# Patient Record
Sex: Female | Born: 1955
Health system: Southern US, Community
[De-identification: ages and names within clinical notes are randomized; demographics above are authoritative.]

## PROBLEM LIST (undated history)

## (undated) DIAGNOSIS — L739 Follicular disorder, unspecified: Secondary | ICD-10-CM

## (undated) DIAGNOSIS — Z8719 Personal history of other diseases of the digestive system: Secondary | ICD-10-CM

## (undated) DIAGNOSIS — T7840XA Allergy, unspecified, initial encounter: Secondary | ICD-10-CM

## (undated) DIAGNOSIS — M79671 Pain in right foot: Secondary | ICD-10-CM

## (undated) DIAGNOSIS — R05 Cough: Secondary | ICD-10-CM

## (undated) DIAGNOSIS — I10 Essential (primary) hypertension: Secondary | ICD-10-CM

## (undated) DIAGNOSIS — E785 Hyperlipidemia, unspecified: Secondary | ICD-10-CM

## (undated) DIAGNOSIS — L500 Allergic urticaria: Secondary | ICD-10-CM

## (undated) DIAGNOSIS — M25462 Effusion, left knee: Secondary | ICD-10-CM

## (undated) HISTORY — DX: Allergy, unspecified, initial encounter: T78.40XA

## (undated) HISTORY — DX: Hyperlipidemia, unspecified: E78.5

## (undated) HISTORY — PX: DILATION AND CURETTAGE OF UTERUS: SHX78

## (undated) HISTORY — DX: Essential (primary) hypertension: I10

## (undated) HISTORY — PX: TUBAL LIGATION: SHX77

---
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
## 1997-07-16 ENCOUNTER — Ambulatory Visit (HOSPITAL_COMMUNITY): Admission: RE | Admit: 1997-07-16 | Discharge: 1997-07-16 | Payer: Self-pay | Admitting: Obstetrics

## 1999-04-13 ENCOUNTER — Encounter: Admission: RE | Admit: 1999-04-13 | Discharge: 1999-04-13 | Payer: Self-pay | Admitting: Obstetrics & Gynecology

## 1999-06-08 ENCOUNTER — Encounter: Admission: RE | Admit: 1999-06-08 | Discharge: 1999-06-08 | Payer: Self-pay | Admitting: Obstetrics & Gynecology

## 1999-09-30 ENCOUNTER — Inpatient Hospital Stay (HOSPITAL_COMMUNITY): Admission: AD | Admit: 1999-09-30 | Discharge: 1999-09-30 | Payer: Self-pay | Admitting: Obstetrics & Gynecology

## 1999-10-03 ENCOUNTER — Emergency Department (HOSPITAL_COMMUNITY): Admission: EM | Admit: 1999-10-03 | Discharge: 1999-10-04 | Payer: Self-pay | Admitting: *Deleted

## 2000-09-05 ENCOUNTER — Encounter: Admission: RE | Admit: 2000-09-05 | Discharge: 2000-09-05 | Payer: Self-pay | Admitting: Obstetrics & Gynecology

## 2000-09-22 ENCOUNTER — Ambulatory Visit (HOSPITAL_COMMUNITY): Admission: RE | Admit: 2000-09-22 | Discharge: 2000-09-22 | Payer: Self-pay | Admitting: Obstetrics & Gynecology

## 2000-10-10 ENCOUNTER — Encounter: Admission: RE | Admit: 2000-10-10 | Discharge: 2000-10-10 | Payer: Self-pay | Admitting: Obstetrics & Gynecology

## 2000-10-10 ENCOUNTER — Encounter: Admission: RE | Admit: 2000-10-10 | Discharge: 2000-10-10 | Payer: Self-pay | Admitting: Internal Medicine

## 2000-12-03 ENCOUNTER — Emergency Department (HOSPITAL_COMMUNITY): Admission: EM | Admit: 2000-12-03 | Discharge: 2000-12-03 | Payer: Self-pay | Admitting: Emergency Medicine

## 2000-12-03 ENCOUNTER — Encounter: Payer: Self-pay | Admitting: Emergency Medicine

## 2001-01-31 ENCOUNTER — Encounter: Admission: RE | Admit: 2001-01-31 | Discharge: 2001-01-31 | Payer: Self-pay | Admitting: Internal Medicine

## 2001-02-01 ENCOUNTER — Encounter: Admission: RE | Admit: 2001-02-01 | Discharge: 2001-02-01 | Payer: Self-pay | Admitting: Internal Medicine

## 2001-02-27 ENCOUNTER — Encounter: Admission: RE | Admit: 2001-02-27 | Discharge: 2001-02-27 | Payer: Self-pay | Admitting: Obstetrics & Gynecology

## 2001-04-30 ENCOUNTER — Encounter: Admission: RE | Admit: 2001-04-30 | Discharge: 2001-04-30 | Payer: Self-pay | Admitting: Internal Medicine

## 2001-07-03 ENCOUNTER — Encounter: Admission: RE | Admit: 2001-07-03 | Discharge: 2001-07-03 | Payer: Self-pay | Admitting: Internal Medicine

## 2001-09-05 ENCOUNTER — Encounter: Admission: RE | Admit: 2001-09-05 | Discharge: 2001-09-05 | Payer: Self-pay

## 2001-12-26 ENCOUNTER — Encounter: Admission: RE | Admit: 2001-12-26 | Discharge: 2001-12-26 | Payer: Self-pay | Admitting: Internal Medicine

## 2002-01-15 ENCOUNTER — Encounter: Admission: RE | Admit: 2002-01-15 | Discharge: 2002-01-15 | Payer: Self-pay | Admitting: Internal Medicine

## 2002-01-30 ENCOUNTER — Emergency Department (HOSPITAL_COMMUNITY): Admission: EM | Admit: 2002-01-30 | Discharge: 2002-01-30 | Payer: Self-pay | Admitting: Emergency Medicine

## 2002-03-25 ENCOUNTER — Encounter: Admission: RE | Admit: 2002-03-25 | Discharge: 2002-03-25 | Payer: Self-pay | Admitting: Internal Medicine

## 2002-04-05 ENCOUNTER — Encounter: Admission: RE | Admit: 2002-04-05 | Discharge: 2002-04-05 | Payer: Self-pay | Admitting: Internal Medicine

## 2002-07-25 ENCOUNTER — Encounter: Admission: RE | Admit: 2002-07-25 | Discharge: 2002-07-25 | Payer: Self-pay | Admitting: Internal Medicine

## 2002-08-28 ENCOUNTER — Encounter: Admission: RE | Admit: 2002-08-28 | Discharge: 2002-08-28 | Payer: Self-pay | Admitting: Internal Medicine

## 2003-01-20 ENCOUNTER — Encounter: Admission: RE | Admit: 2003-01-20 | Discharge: 2003-01-20 | Payer: Self-pay | Admitting: Internal Medicine

## 2003-01-24 ENCOUNTER — Encounter: Admission: RE | Admit: 2003-01-24 | Discharge: 2003-01-24 | Payer: Self-pay | Admitting: Sports Medicine

## 2003-01-24 ENCOUNTER — Encounter: Payer: Self-pay | Admitting: Sports Medicine

## 2003-05-16 ENCOUNTER — Emergency Department (HOSPITAL_COMMUNITY): Admission: AD | Admit: 2003-05-16 | Discharge: 2003-05-16 | Payer: Self-pay | Admitting: Family Medicine

## 2003-07-01 ENCOUNTER — Encounter: Admission: RE | Admit: 2003-07-01 | Discharge: 2003-07-01 | Payer: Self-pay | Admitting: Internal Medicine

## 2003-10-20 ENCOUNTER — Emergency Department (HOSPITAL_COMMUNITY): Admission: EM | Admit: 2003-10-20 | Discharge: 2003-10-20 | Payer: Self-pay | Admitting: Emergency Medicine

## 2003-11-27 ENCOUNTER — Emergency Department (HOSPITAL_COMMUNITY): Admission: EM | Admit: 2003-11-27 | Discharge: 2003-11-27 | Payer: Self-pay | Admitting: Family Medicine

## 2003-12-15 ENCOUNTER — Emergency Department (HOSPITAL_COMMUNITY): Admission: EM | Admit: 2003-12-15 | Discharge: 2003-12-15 | Payer: Self-pay | Admitting: Family Medicine

## 2004-01-21 ENCOUNTER — Encounter: Admission: RE | Admit: 2004-01-21 | Discharge: 2004-01-21 | Payer: Self-pay | Admitting: Internal Medicine

## 2004-02-06 ENCOUNTER — Ambulatory Visit: Payer: Self-pay | Admitting: Internal Medicine

## 2004-04-07 ENCOUNTER — Emergency Department (HOSPITAL_COMMUNITY): Admission: EM | Admit: 2004-04-07 | Discharge: 2004-04-07 | Payer: Self-pay | Admitting: Family Medicine

## 2004-05-03 ENCOUNTER — Emergency Department (HOSPITAL_COMMUNITY): Admission: EM | Admit: 2004-05-03 | Discharge: 2004-05-03 | Payer: Self-pay | Admitting: Family Medicine

## 2004-07-13 ENCOUNTER — Emergency Department (HOSPITAL_COMMUNITY): Admission: EM | Admit: 2004-07-13 | Discharge: 2004-07-13 | Payer: Self-pay | Admitting: Family Medicine

## 2004-08-02 ENCOUNTER — Emergency Department (HOSPITAL_COMMUNITY): Admission: EM | Admit: 2004-08-02 | Discharge: 2004-08-02 | Payer: Self-pay | Admitting: Family Medicine

## 2004-10-08 ENCOUNTER — Ambulatory Visit: Payer: Self-pay | Admitting: Internal Medicine

## 2005-04-12 ENCOUNTER — Emergency Department (HOSPITAL_COMMUNITY): Admission: EM | Admit: 2005-04-12 | Discharge: 2005-04-12 | Payer: Self-pay | Admitting: Emergency Medicine

## 2005-05-28 ENCOUNTER — Emergency Department (HOSPITAL_COMMUNITY): Admission: EM | Admit: 2005-05-28 | Discharge: 2005-05-28 | Payer: Self-pay | Admitting: Family Medicine

## 2005-07-14 ENCOUNTER — Inpatient Hospital Stay (HOSPITAL_COMMUNITY): Admission: AD | Admit: 2005-07-14 | Discharge: 2005-07-14 | Payer: Self-pay

## 2005-10-19 ENCOUNTER — Encounter (INDEPENDENT_AMBULATORY_CARE_PROVIDER_SITE_OTHER): Payer: Self-pay | Admitting: *Deleted

## 2005-10-19 ENCOUNTER — Ambulatory Visit: Payer: Self-pay | Admitting: Obstetrics & Gynecology

## 2005-10-19 ENCOUNTER — Encounter: Payer: Self-pay | Admitting: Obstetrics & Gynecology

## 2005-10-19 LAB — CONVERTED CEMR LAB: Pap Smear: NORMAL

## 2005-11-17 ENCOUNTER — Ambulatory Visit (HOSPITAL_COMMUNITY): Admission: RE | Admit: 2005-11-17 | Discharge: 2005-11-17 | Payer: Self-pay | Admitting: Obstetrics & Gynecology

## 2006-03-23 ENCOUNTER — Encounter (INDEPENDENT_AMBULATORY_CARE_PROVIDER_SITE_OTHER): Payer: Self-pay | Admitting: *Deleted

## 2006-03-23 ENCOUNTER — Encounter (INDEPENDENT_AMBULATORY_CARE_PROVIDER_SITE_OTHER): Payer: Self-pay | Admitting: Infectious Diseases

## 2006-03-23 ENCOUNTER — Ambulatory Visit: Payer: Self-pay | Admitting: Internal Medicine

## 2006-03-23 DIAGNOSIS — J309 Allergic rhinitis, unspecified: Secondary | ICD-10-CM | POA: Insufficient documentation

## 2006-03-23 DIAGNOSIS — E785 Hyperlipidemia, unspecified: Secondary | ICD-10-CM | POA: Insufficient documentation

## 2006-03-23 DIAGNOSIS — G4700 Insomnia, unspecified: Secondary | ICD-10-CM | POA: Insufficient documentation

## 2006-03-23 LAB — CONVERTED CEMR LAB
AST: 23 units/L (ref 0–37)
Albumin: 4 g/dL (ref 3.5–5.2)
Alkaline Phosphatase: 111 units/L (ref 39–117)
Chloride: 105 meq/L (ref 96–112)
Creatinine, Ser: 0.69 mg/dL (ref 0.40–1.20)
HDL: 69 mg/dL (ref >39)
LDL Cholesterol: 182 mg/dL — ABNORMAL HIGH (ref 0–99)
Potassium: 3.8 meq/L (ref 3.5–5.3)
Sodium: 139 meq/L (ref 135–145)
Total Protein: 7 g/dL (ref 6.0–8.3)

## 2006-05-04 ENCOUNTER — Ambulatory Visit: Payer: Self-pay | Admitting: Internal Medicine

## 2006-05-04 ENCOUNTER — Ambulatory Visit (HOSPITAL_COMMUNITY): Admission: RE | Admit: 2006-05-04 | Discharge: 2006-05-04 | Payer: Self-pay | Admitting: Internal Medicine

## 2006-05-04 ENCOUNTER — Encounter (INDEPENDENT_AMBULATORY_CARE_PROVIDER_SITE_OTHER): Payer: Self-pay | Admitting: *Deleted

## 2006-05-04 LAB — CONVERTED CEMR LAB
HCT: 42.4 % (ref 34.4–43.3)
MCV: 87.1 fL (ref 78.8–100.0)
Platelets: 304 10*3/uL (ref 152–374)
RDW: 14.3 % (ref 11.5–15.3)
WBC: 5.1 10*3/uL (ref 3.7–10.0)

## 2006-05-05 DIAGNOSIS — I10 Essential (primary) hypertension: Secondary | ICD-10-CM | POA: Insufficient documentation

## 2006-05-05 DIAGNOSIS — I498 Other specified cardiac arrhythmias: Secondary | ICD-10-CM | POA: Insufficient documentation

## 2006-09-24 ENCOUNTER — Emergency Department (HOSPITAL_COMMUNITY): Admission: EM | Admit: 2006-09-24 | Discharge: 2006-09-24 | Payer: Self-pay | Admitting: Emergency Medicine

## 2006-11-14 DIAGNOSIS — L509 Urticaria, unspecified: Secondary | ICD-10-CM | POA: Insufficient documentation

## 2006-12-04 ENCOUNTER — Emergency Department (HOSPITAL_COMMUNITY): Admission: EM | Admit: 2006-12-04 | Discharge: 2006-12-04 | Payer: Self-pay | Admitting: Family Medicine

## 2006-12-14 ENCOUNTER — Encounter (INDEPENDENT_AMBULATORY_CARE_PROVIDER_SITE_OTHER): Payer: Self-pay | Admitting: Internal Medicine

## 2006-12-14 ENCOUNTER — Ambulatory Visit: Payer: Self-pay | Admitting: Internal Medicine

## 2007-01-03 ENCOUNTER — Ambulatory Visit: Payer: Self-pay | Admitting: Internal Medicine

## 2007-02-05 ENCOUNTER — Ambulatory Visit (HOSPITAL_COMMUNITY): Admission: RE | Admit: 2007-02-05 | Discharge: 2007-02-05 | Payer: Self-pay | Admitting: Internal Medicine

## 2007-02-20 ENCOUNTER — Emergency Department (HOSPITAL_COMMUNITY): Admission: EM | Admit: 2007-02-20 | Discharge: 2007-02-20 | Payer: Self-pay | Admitting: Emergency Medicine

## 2007-03-01 ENCOUNTER — Ambulatory Visit: Payer: Self-pay | Admitting: Obstetrics and Gynecology

## 2007-03-05 ENCOUNTER — Ambulatory Visit (HOSPITAL_COMMUNITY): Admission: RE | Admit: 2007-03-05 | Discharge: 2007-03-05 | Payer: Self-pay | Admitting: Obstetrics and Gynecology

## 2007-03-12 ENCOUNTER — Emergency Department (HOSPITAL_COMMUNITY): Admission: EM | Admit: 2007-03-12 | Discharge: 2007-03-12 | Payer: Self-pay | Admitting: Emergency Medicine

## 2007-03-15 ENCOUNTER — Ambulatory Visit: Payer: Self-pay | Admitting: Obstetrics & Gynecology

## 2007-04-23 ENCOUNTER — Emergency Department (HOSPITAL_COMMUNITY): Admission: EM | Admit: 2007-04-23 | Discharge: 2007-04-23 | Payer: Self-pay | Admitting: Emergency Medicine

## 2007-12-28 ENCOUNTER — Ambulatory Visit: Payer: Self-pay | Admitting: Internal Medicine

## 2007-12-28 ENCOUNTER — Encounter (INDEPENDENT_AMBULATORY_CARE_PROVIDER_SITE_OTHER): Payer: Self-pay | Admitting: Internal Medicine

## 2008-01-15 ENCOUNTER — Telehealth: Payer: Self-pay | Admitting: *Deleted

## 2008-03-04 ENCOUNTER — Ambulatory Visit (HOSPITAL_COMMUNITY): Admission: RE | Admit: 2008-03-04 | Discharge: 2008-03-04 | Payer: Self-pay | Admitting: Internal Medicine

## 2008-03-04 LAB — HM MAMMOGRAPHY: HM Mammogram: NEGATIVE

## 2008-03-28 ENCOUNTER — Encounter: Payer: Self-pay | Admitting: Obstetrics & Gynecology

## 2008-03-28 ENCOUNTER — Ambulatory Visit: Payer: Self-pay | Admitting: Internal Medicine

## 2008-03-28 ENCOUNTER — Encounter (INDEPENDENT_AMBULATORY_CARE_PROVIDER_SITE_OTHER): Payer: Self-pay | Admitting: Internal Medicine

## 2008-03-28 ENCOUNTER — Ambulatory Visit: Payer: Self-pay | Admitting: Obstetrics & Gynecology

## 2008-03-28 LAB — CONVERTED CEMR LAB
ALT: 13 units/L (ref 0–35)
AST: 23 units/L (ref 0–37)
Albumin: 4.2 g/dL (ref 3.5–5.2)
Alkaline Phosphatase: 99 units/L (ref 39–117)
BUN: 11 mg/dL (ref 6–23)
CO2: 24 meq/L (ref 19–32)
Calcium: 9.7 mg/dL (ref 8.4–10.5)
Chloride: 104 meq/L (ref 96–112)
Cholesterol: 289 mg/dL — ABNORMAL HIGH (ref 0–200)
Creatinine, Ser: 0.73 mg/dL (ref 0.40–1.20)
Glucose, Bld: 82 mg/dL (ref 70–99)
HDL: 72 mg/dL (ref >39)
LDL Cholesterol: 193 mg/dL — ABNORMAL HIGH (ref 0–99)
Potassium: 3.9 meq/L (ref 3.5–5.3)
Sodium: 141 meq/L (ref 135–145)
TSH: 1.579 microintl units/mL (ref 0.350–4.50)
Total Bilirubin: 0.5 mg/dL (ref 0.3–1.2)
Total CHOL/HDL Ratio: 4
Total Protein: 7.1 g/dL (ref 6.0–8.3)
Triglycerides: 121 mg/dL (ref <150)
VLDL: 24 mg/dL (ref 0–40)

## 2008-03-29 ENCOUNTER — Encounter: Payer: Self-pay | Admitting: Obstetrics & Gynecology

## 2008-03-29 LAB — CONVERTED CEMR LAB: Yeast Wet Prep HPF POC: NONE SEEN

## 2008-04-01 LAB — CONVERTED CEMR LAB
ALT: 12 units/L (ref 0–35)
AST: 24 units/L (ref 0–37)
Alkaline Phosphatase: 104 units/L (ref 39–117)
Bilirubin, Direct: 0.1 mg/dL (ref 0.0–0.3)
HDL: 73 mg/dL (ref >39)
LDL Cholesterol: 188 mg/dL — ABNORMAL HIGH (ref 0–99)
Total Bilirubin: 0.6 mg/dL (ref 0.3–1.2)
Total CHOL/HDL Ratio: 3.9
Total Protein: 6.9 g/dL (ref 6.0–8.3)
Triglycerides: 127 mg/dL (ref <150)
VLDL: 25 mg/dL (ref 0–40)

## 2008-04-28 ENCOUNTER — Telehealth (INDEPENDENT_AMBULATORY_CARE_PROVIDER_SITE_OTHER): Payer: Self-pay | Admitting: Internal Medicine

## 2008-05-13 ENCOUNTER — Encounter (INDEPENDENT_AMBULATORY_CARE_PROVIDER_SITE_OTHER): Payer: Self-pay | Admitting: *Deleted

## 2008-05-13 ENCOUNTER — Ambulatory Visit: Payer: Self-pay | Admitting: Internal Medicine

## 2008-05-13 DIAGNOSIS — J019 Acute sinusitis, unspecified: Secondary | ICD-10-CM | POA: Insufficient documentation

## 2008-05-13 DIAGNOSIS — J329 Chronic sinusitis, unspecified: Secondary | ICD-10-CM | POA: Insufficient documentation

## 2008-05-13 LAB — CONVERTED CEMR LAB
BUN: 12 mg/dL
CO2: 22 meq/L
Calcium: 9.4 mg/dL
Chloride: 107 meq/L
Creatinine, Ser: 0.65 mg/dL
Glucose, Bld: 107 mg/dL — ABNORMAL HIGH
Potassium: 3.7 meq/L
Sodium: 142 meq/L

## 2008-06-13 ENCOUNTER — Encounter (INDEPENDENT_AMBULATORY_CARE_PROVIDER_SITE_OTHER): Payer: Self-pay | Admitting: *Deleted

## 2008-08-11 ENCOUNTER — Ambulatory Visit: Payer: Self-pay | Admitting: Internal Medicine

## 2008-08-11 ENCOUNTER — Encounter (INDEPENDENT_AMBULATORY_CARE_PROVIDER_SITE_OTHER): Payer: Self-pay | Admitting: *Deleted

## 2008-08-11 ENCOUNTER — Encounter (INDEPENDENT_AMBULATORY_CARE_PROVIDER_SITE_OTHER): Payer: Self-pay | Admitting: Internal Medicine

## 2008-08-11 DIAGNOSIS — J029 Acute pharyngitis, unspecified: Secondary | ICD-10-CM | POA: Insufficient documentation

## 2008-08-11 LAB — CONVERTED CEMR LAB: Streptococcus, Group A Screen (Direct): NEGATIVE

## 2008-08-12 ENCOUNTER — Telehealth (INDEPENDENT_AMBULATORY_CARE_PROVIDER_SITE_OTHER): Payer: Self-pay | Admitting: Internal Medicine

## 2009-02-05 ENCOUNTER — Ambulatory Visit: Payer: Self-pay | Admitting: Infectious Diseases

## 2009-02-05 DIAGNOSIS — L909 Atrophic disorder of skin, unspecified: Secondary | ICD-10-CM | POA: Insufficient documentation

## 2009-02-05 DIAGNOSIS — L919 Hypertrophic disorder of the skin, unspecified: Secondary | ICD-10-CM

## 2009-03-23 ENCOUNTER — Ambulatory Visit: Payer: Self-pay | Admitting: Internal Medicine

## 2009-03-23 DIAGNOSIS — M79609 Pain in unspecified limb: Secondary | ICD-10-CM | POA: Insufficient documentation

## 2009-03-23 LAB — BASIC METABOLIC PANEL: BUN: 9 mg/dL (ref 4–21)

## 2009-03-24 LAB — CONVERTED CEMR LAB
Chloride: 103 meq/L (ref 96–112)
Glucose, Bld: 84 mg/dL (ref 70–99)
Sodium: 142 meq/L (ref 135–145)

## 2009-04-29 ENCOUNTER — Ambulatory Visit (HOSPITAL_COMMUNITY): Admission: RE | Admit: 2009-04-29 | Discharge: 2009-04-29 | Payer: Self-pay | Admitting: Family Medicine

## 2009-08-31 ENCOUNTER — Telehealth: Payer: Self-pay | Admitting: Internal Medicine

## 2010-01-05 ENCOUNTER — Ambulatory Visit: Payer: Self-pay | Admitting: Internal Medicine

## 2010-01-05 LAB — CONVERTED CEMR LAB
Chlamydia, DNA Probe: NEGATIVE
GC Probe Amp, Genital: NEGATIVE
Pap Smear: NEGATIVE

## 2010-03-03 IMAGING — MG MM DIGITAL SCREENING BILAT
5 series · 5 of 5 positions shown · non-contrast
Comparison: none

DG SCREEN MAMMOGRAM BILATERAL
Bilateral CC and MLO view(s) were taken.
Technologist: Belen, Parvinder.(VILLA)(M)

DIGITAL SCREENING MAMMOGRAM WITH CAD:
There are scattered fibroglandular densities.  No masses or malignant type calcifications are 
identified.  Compared with prior studies.

[R CC]
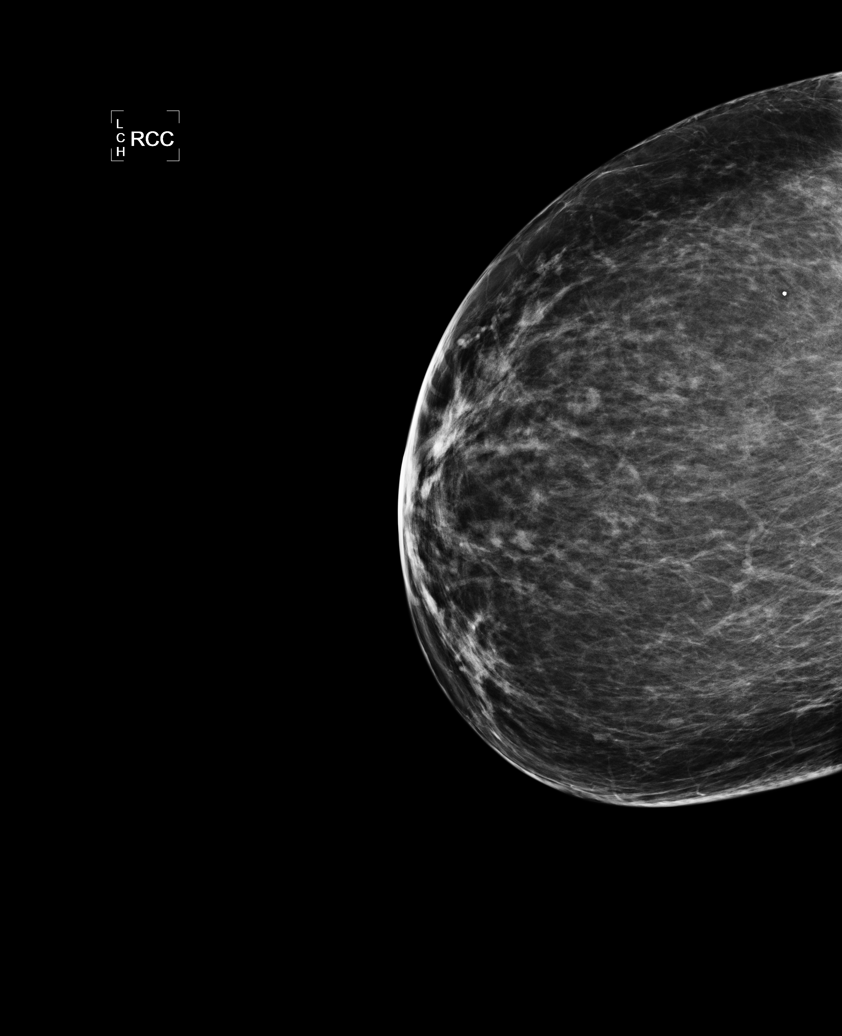

[R MLO]
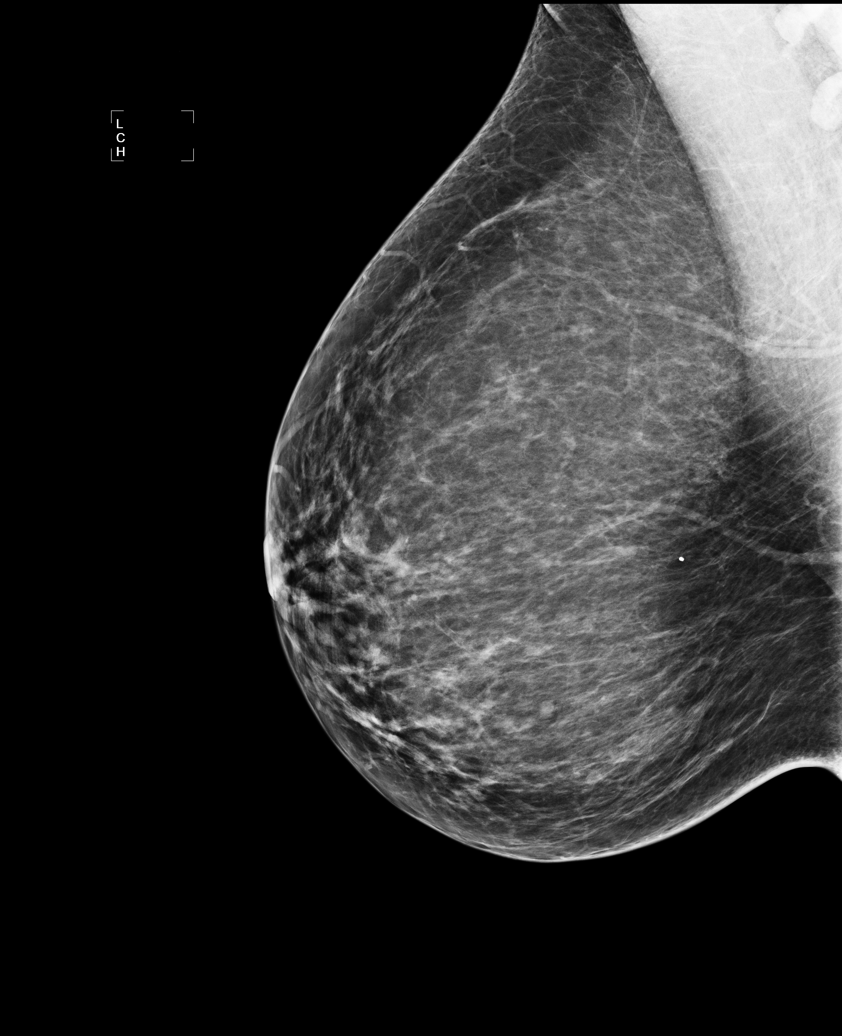

[L CC]
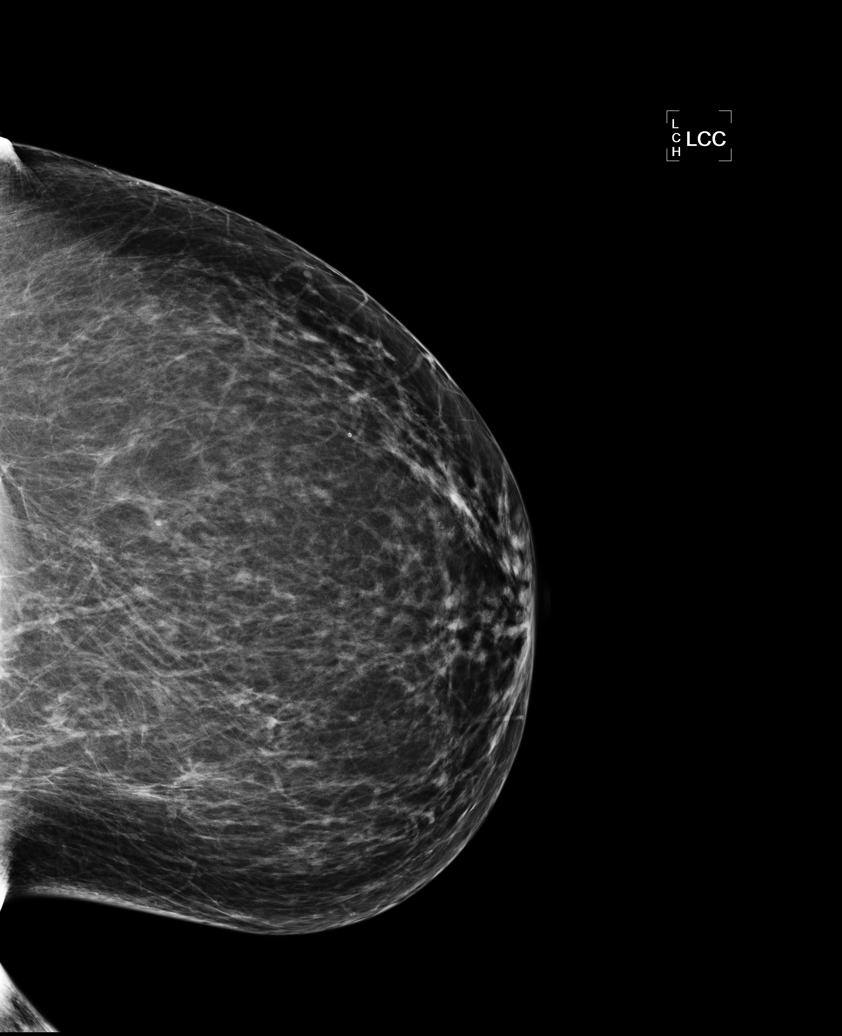

[L MLO]
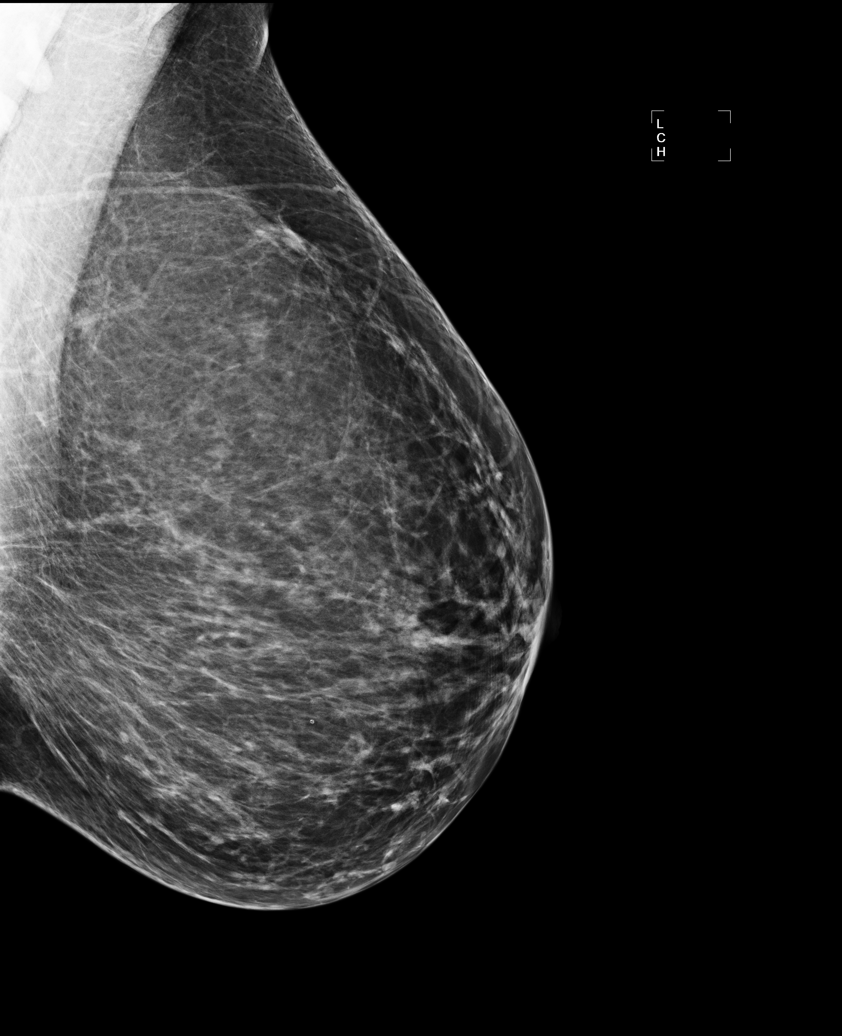

[R XCCL]
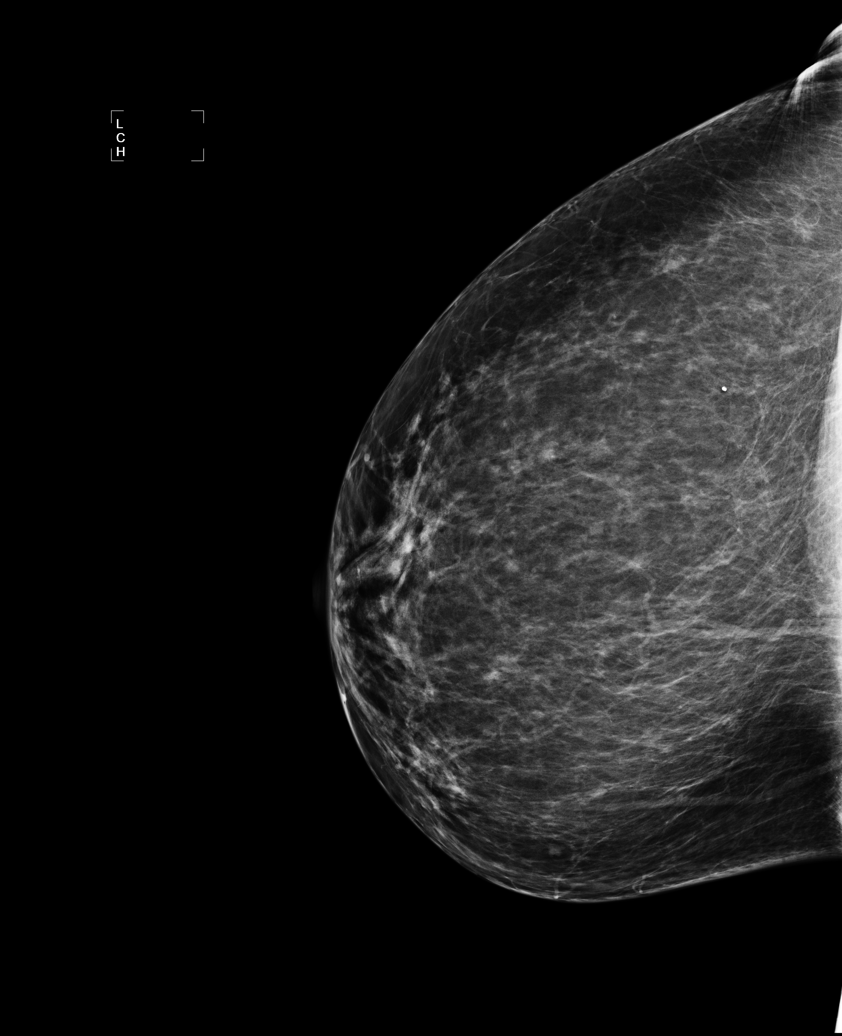

[5 of 5 positions shown; findings below may reference images not displayed]

IMPRESSION: No specific mammographic evidence of malignancy.  Next screening mammogram is recommended in one 
year.

ASSESSMENT: Negative - BI-RADS 1

Screening mammogram in 1 year.
ANALYZED BY COMPUTER AIDED DETECTION. , THIS PROCEDURE WAS A DIGITAL MAMMOGRAM.

## 2010-03-17 ENCOUNTER — Telehealth: Payer: Self-pay | Admitting: Internal Medicine

## 2010-06-23 ENCOUNTER — Telehealth: Payer: Self-pay | Admitting: Internal Medicine

## 2010-07-01 NOTE — Progress Notes (Signed)
Summary: refill/ hla  Phone Note Refill Request Message from:  Patient on June 23, 2010 11:39 AM  Refills Requested: Medication #1:  HYDROCHLOROTHIAZIDE 25 MG TABS Take 1 tablet by mouth once a day   Dosage confirmed as above?Dosage Confirmed Initial call taken by: Marin Roberts RN,  June 23, 2010 11:39 AM  Follow-up for Phone Call        completed refill, thank you Iskra  Follow-up by: Mliss Sax MD,  June 23, 2010 12:12 PM    Prescriptions: HYDROCHLOROTHIAZIDE 25 MG TABS (HYDROCHLOROTHIAZIDE) Take 1 tablet by mouth once a day  #31 x 7   Entered and Authorized by:   Mliss Sax MD   Signed by:   Mliss Sax MD on 06/23/2010   Method used:   [facility]        [facility] [address] [phone]* (retail)       [address]       [place], [place]  [address]       Ph: [phone] or [phone]       Fax: [phone]   RxID:   [id number]

## 2010-07-01 NOTE — Assessment & Plan Note (Signed)
Summary: ACUTE-SKIN TAG/CFB   Vital Signs:  Patient profile:   55 year old female Height:      66 inches (167.64 cm) Weight:      168.5 pounds (76.59 kg) BMI:     27.29 Temp:     98.2 degrees F (36.78 degrees C) oral Pulse rate:   91 / minute BP sitting:   138 / 87  (right arm)  Vitals Entered By: Stanton Kidney Ditzler RN (January 05, 2010 4:17 PM) Is Patient Diabetic? No Pain Assessment Patient in pain? no      Nutritional Status BMI of 25 - 29 = overweight Nutritional Status Detail appetite good  Have you ever been in a relationship where you felt threatened, hurt or afraid?denies   Does patient need assistance? Functional Status Self care Ambulation Normal Comments Needs pap LMP 8 yearas ago. Refills on meds. Dental referral. To remove skin tag up right thigh. Needs to see Rudell Cobb.   Primary Care Provider:  Mliss Sax MD   History of Present Illness: 55 yo female with PMhx outlined below presents to [facility] for regular follow up appointment. She has no concerns at the time. No recent sicknesses or hospitalizaitons. No episodes of chest pain, SOB, palpitations, no fever or chills. No specific abdominal or urinary concerns. No recent changes in appetite, weight, sleep patterns, mood.    Depression History:      The patient denies a depressed mood most of the day and a diminished interest in her usual daily activities.  The patient denies significant weight loss, significant weight gain, insomnia, hypersomnia, psychomotor agitation, psychomotor retardation, fatigue (loss of energy), feelings of worthlessness (guilt), impaired concentration (indecisiveness), and recurrent thoughts of death or suicide.        The patient denies that she feels like life is not worth living, denies that she wishes that she were dead, and denies that she has thought about ending her life.         Preventive Screening-Counseling & Management  Alcohol-Tobacco     Alcohol type: AT TIMES     Smoking  Status: current     Smoking Cessation Counseling: yes     Packs/Day: 6 cigs per day     Year Started: 1977     Passive Smoke Exposure: yes  Caffeine-Diet-Exercise     Does Patient Exercise: no  Problems Prior to Update: 1)  Arm Pain, Left  (ICD-729.5) 2)  Skin Tag  (ICD-701.9) 3)  Acute Pharyngitis  (ICD-462) 4)  Sinusitis, Acute  (ICD-461.9) 5)  Preventive Health Care  (ICD-V70.0) 6)  Urticaria Nos  (ICD-708.9) 7)  Insomnia  (ICD-780.52) 8)  Hypertension  (ICD-401.9) 9)  Hyperlipidemia  (ICD-272.4) 10)  Tobacco User  (ICD-305.1) 11)  Allergic Rhinitis  (ICD-477.9) 12)  Sinus Tachycardia  (ICD-427.89)  Medications Prior to Update: 1)  Hydrochlorothiazide 25 Mg Tabs (Hydrochlorothiazide) .... Take 1 Tablet By Mouth Once A Day 2)  Zyrtec Allergy 10 Mg Tabs (Cetirizine Hcl) .... Take 1 Tablet By Mouth Once A Day 3)  Pravastatin Sodium 20 Mg Tabs (Pravastatin Sodium) .... Take 1 Tablet By Mouth Once A Day 4)  Tylenol 8 Hour 650 Mg Cr-Tabs (Acetaminophen) .... Take 1 Tablet By Mouth Every 6-8 Hours As Needed For Pain 5)  Klor-Con M10 10 Meq Cr-Tabs (Potassium Chloride Crys Cr) .... Take 1 Tablet By Mouth Once A Day  Current Medications (verified): 1)  Hydrochlorothiazide 25 Mg Tabs (Hydrochlorothiazide) .... Take 1 Tablet By Mouth Once A Day  2)  Zyrtec Allergy 10 Mg Tabs (Cetirizine Hcl) .... Take 1 Tablet By Mouth Once A Day 3)  Pravastatin Sodium 20 Mg Tabs (Pravastatin Sodium) .... Take 1 Tablet By Mouth Once A Day 4)  Tylenol 8 Hour 650 Mg Cr-Tabs (Acetaminophen) .... Take 1 Tablet By Mouth Every 6-8 Hours As Needed For Pain 5)  Klor-Con M10 10 Meq Cr-Tabs (Potassium Chloride Crys Cr) .... Take 1 Tablet By Mouth Once A Day  Allergies: 1)  ! Vicodin 2)  ! 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  Social History: Last updated: 03/23/2009 Married Current Smoker Alcohol use-no Drug use-no  Risk Factors: Exercise: no (01/05/2010)  Risk Factors: Smoking Status: current (01/05/2010) Packs/Day: 6 cigs per day (01/05/2010) Passive Smoke Exposure: yes (01/05/2010)  Family History: Reviewed history from 02/05/2009 and no changes required. unknown to her.  Social History: Reviewed history from 03/23/2009 and no changes required. Married Current Smoker Alcohol use-no Drug use-no Packs/Day:  6 cigs per day  Review of Systems       per HPI  Physical Exam  General:  Well-developed,well-nourished,in no acute distress; alert,appropriate and cooperative throughout examination Lungs:  normal respiratory effort, normal breath sounds, no crackles, and no wheezes.   Heart:  normal rate, regular rhythm, no murmur, and no JVD.   Abdomen:  soft, non-tender, normal bowel sounds, no distention, no masses, and no guarding.   Neurologic:  alert & oriented X3, cranial nerves II-XII intact, strength normal in all extremities, sensation intact to light touch, gait normal, and DTRs symmetrical and normal.   Psych:  Oriented X3, memory intact for recent and remote, normally interactive, and good eye contact.   Impression & Recommendations:  Problem # 1:  SKIN TAG (ICD-701.9)  Pt wants to see dermatologist. WIll send referral.   Orders: Dermatology Referral (Derma)  Problem # 2:  HYPERTENSION (ICD-401.9) Improving, cont the same regimen.  Her updated medication list for this problem includes:    Hydrochlorothiazide 25 Mg Tabs (Hydrochlorothiazide) .Marland Kitchen... Take 1 tablet by mouth once a day  BP today: 138/87 Prior BP: 154/90 (03/23/2009)  Labs Reviewed: K+: 3.4 (03/23/2009) Creat: : 0.73 (03/23/2009)   Chol: 286 (03/28/2008)   HDL: 73 (03/28/2008)   LDL: 188 (03/28/2008)   TG: 127 (03/28/2008)  Problem # 3:  HYPERLIPIDEMIA (ICD-272.4) Improving,  will cont the same regimen.  Her updated medication list for this problem includes:    Pravastatin Sodium 20 Mg Tabs (Pravastatin sodium) .Marland Kitchen... Take 1 tablet by mouth once a day  Labs Reviewed: SGOT: 24 (03/28/2008)   SGPT: 12 (03/28/2008)   HDL:73 (03/28/2008), 72 (12/28/2007)  LDL:188 (03/28/2008), 193 (12/28/2007)  Chol:286 (03/28/2008), 289 (12/28/2007)  Trig:127 (03/28/2008), 121 (12/28/2007)  Problem # 4:  TOBACCO USER (ICD-305.1)  Encouraged smoking cessation and discussed different methods for smoking cessation.   Problem # 5:  PREVENTIVE HEALTH CARE (ICD-V70.0)  Orders: T-Chlamydia & GC Probe, Genital ([id number]) T-Pap Smear, Thin Prep (47829) T-Wet Prep by [facility] [phone])  Complete Medication List: 1)  Hydrochlorothiazide 25 Mg Tabs (Hydrochlorothiazide) .... Take 1 tablet by mouth once a day 2)  Zyrtec Allergy 10 Mg Tabs (Cetirizine hcl) .... Take 1 tablet by mouth once a day 3)  Pravastatin Sodium 20 Mg Tabs (Pravastatin sodium) .... Take 1 tablet by mouth once a day 4)  Tylenol 8  Hour 650 Mg Cr-tabs (Acetaminophen) .... Take 1 tablet by mouth every 6-8 hours as needed for pain 5)  Klor-con M10 10 Meq Cr-tabs (Potassium chloride crys cr) .... Take 1 tablet by mouth once a day  Patient Instructions: 1)  Please schedule a follow-up appointment in 6 months. Process Orders Check Orders Results:     [facility]: ABN not required for this insurance Order queued for requisitioning for [facility]: January 05, 2010 4:51 PM  Tests Sent for requisitioning (January 05, 2010 4:51 PM):     01/05/2010: [facility] -- T-Chlamydia & GC Probe, Genital [[id number]] (signed)     01/05/2010: [facility] -- T-Pap Smear, Thin Prep [[id number]] (signed)     01/05/2010: [facility] -- T-Wet Prep by [facility] [phone] (signed)     Prevention & Chronic Care Immunizations   Influenza vaccine: Not  documented   Influenza vaccine deferral: Refused  (03/23/2009)    Tetanus booster: Not documented   Td booster deferral: Not indicated  (01/05/2010)    Pneumococcal vaccine: Not documented  Colorectal Screening   Hemoccult: Not documented   Hemoccult action/deferral: Not indicated  (01/05/2010)    Colonoscopy: Not documented   Colonoscopy action/deferral: Refused  (03/23/2009)  Other Screening   Pap smear: NEGATIVE FOR INTRAEPITHELIAL LESIONS OR MALIGNANCY.  (03/28/2008)   Pap smear action/deferral: Deferred  (03/23/2009)    Mammogram: ASSESSMENT: Negative - BI-RADS 1^MM DIGITAL SCREENING  (03/04/2008)   Mammogram action/deferral: Refused  (01/05/2010)   Smoking status: current  (01/05/2010)   Smoking cessation counseling: yes  (01/05/2010)  Lipids   Total Cholesterol: 286  (03/28/2008)   Lipid panel action/deferral: Lipid Panel ordered   LDL: 188  (03/28/2008)   LDL Direct: Not documented   HDL: 73  (03/28/2008)   Triglycerides: 127  (03/28/2008)    SGOT (AST): 24  (03/28/2008)   SGPT (ALT): 12  (03/28/2008)   Alkaline phosphatase: 104  (03/28/2008)   Total bilirubin: 0.6  (03/28/2008)    Lipid flowsheet reviewed?: Yes   Progress toward LDL goal: Unchanged  Hypertension   Last Blood Pressure: 138 / 87  (01/05/2010)   Serum creatinine: 0.73  (03/23/2009)   BMP action: Ordered   Serum potassium 3.4  (03/23/2009)    Hypertension flowsheet reviewed?: Yes   Progress toward BP goal: At goal  Self-Management Support :   Personal Goals (by the next clinic visit) :      Personal blood pressure goal: 140/90  (01/05/2010)     Personal LDL goal: 100  (01/05/2010)    Patient will work on the following items until the next clinic visit to reach self-care goals:     Medications and monitoring: take my medicines every day, check my blood pressure  (03/23/2009)     Eating: eat more vegetables, use fresh or frozen vegetables, eat foods that are low in salt, eat fruit for  snacks and desserts  (01/05/2010)     Activity: take a 30 minute walk every day, park at the far end of the parking lot  (01/05/2010)    Hypertension self-management support: Written self-care plan, Education handout, Resources for patients handout  (01/05/2010)   Hypertension self-care plan printed.   Hypertension education handout printed    Lipid self-management support: Written self-care plan, Education handout, Resources for patients handout  (01/05/2010)   Lipid self-care plan printed.   Lipid education handout printed      Resource handout printed.

## 2010-07-01 NOTE — Progress Notes (Signed)
Summary: med refill/gp  Phone Note Refill Request Message from:  Patient on August 31, 2009 2:35 PM  Refills Requested: Medication #1:  ZYRTEC ALLERGY 10 MG TABS Take 1 tablet by mouth once a day  Method Requested: Electronic Initial call taken by: Chinita Pester RN,  August 31, 2009 2:35 PM  Follow-up for Phone Call        completed Follow-up by: Mliss Sax MD,  August 31, 2009 2:53 PM    Prescriptions: ZYRTEC ALLERGY 10 MG TABS (CETIRIZINE HCL) Take 1 tablet by mouth once a day  #30 x 6   Entered and Authorized by:   Mliss Sax MD   Signed by:   Mliss Sax MD on 08/31/2009   Method used:   [facility]        [facility] [address] [phone]* (retail)       [address]       [place], [place]  [address]       Ph: [phone] or [phone]       Fax: [phone]   RxID:   [id number]   Appended Document: med refill/gp Pt. was called about above Rx has been sent in.

## 2010-07-01 NOTE — Progress Notes (Signed)
Summary: med refill/gp  Phone Note Refill Request Message from:  Fax from Pharmacy on March 17, 2010 2:15 PM  Refills Requested: Medication #1:  HYDROCHLOROTHIAZIDE 25 MG TABS Take 1 tablet by mouth once a day   Last Refilled: 01/05/2010  Method Requested: Electronic Initial call taken by: Chinita Pester RN,  March 17, 2010 2:15 PM  Follow-up for Phone Call        [name], I will only approved 1 month refill on her HCTZ; she has not have any Labs since end of 2010.Marland Kitchen Please arrange followup for her.    Prescriptions: HYDROCHLOROTHIAZIDE 25 MG TABS (HYDROCHLOROTHIAZIDE) Take 1 tablet by mouth once a day  #31 x 1   Entered and Authorized by:   Vassie Loll MD   Signed by:   Vassie Loll MD on 03/18/2010   Method used:   [facility]ron[facility] to        [facility] [address] [phone]* (retail)       [address]       [place], [place]  [address]       Ph: [phone] or [phone]       Fax: [phone]   RxID:   [id number]

## 2010-07-24 ENCOUNTER — Encounter: Payer: Self-pay | Admitting: Internal Medicine

## 2010-08-02 ENCOUNTER — Other Ambulatory Visit: Payer: Self-pay | Admitting: Internal Medicine

## 2010-08-02 DIAGNOSIS — Z1231 Encounter for screening mammogram for malignant neoplasm of breast: Secondary | ICD-10-CM

## 2010-08-06 ENCOUNTER — Ambulatory Visit (HOSPITAL_COMMUNITY): Payer: Self-pay

## 2010-08-13 ENCOUNTER — Ambulatory Visit (HOSPITAL_COMMUNITY)
Admission: RE | Admit: 2010-08-13 | Discharge: 2010-08-13 | Disposition: A | Discharge status: Home / Self Care | Payer: Self-pay | Source: Ambulatory Visit | Attending: Internal Medicine | Admitting: Internal Medicine

## 2010-08-13 DIAGNOSIS — Z1231 Encounter for screening mammogram for malignant neoplasm of breast: Secondary | ICD-10-CM | POA: Insufficient documentation

## 2010-10-12 NOTE — Group Therapy Note (Signed)
NAME:  Caitlyn Ingram, Caitlyn Ingram NO.:  [id number]   MEDICAL RECORD NO.:  [id number]          PATIENT TYPE:  WOC   LOCATION:  [facility]                   FACILITY:  [facility]   PHYSICIAN:  Argentina Donovan, MD        DATE OF BIRTH:  1955/07/29   DATE OF SERVICE:  03/01/2007                                  CLINIC NOTE   The patient is a 55 year old African American female who stopped having  periods 9 years ago.  She was tested and placed on estrogen which she  did not take for more than a few months.  She is gravida 2, para 2-0-0-  2.  She does smoke heavily.  She was at work one day and felt strong  cramp.  Went to the bathroom and saw some dark brownish, red bleeding,  not much, for the vagina.  She had a second episode about 2 weeks later.  That was 2 months ago and has not had any since.   Examination external genitalia is normal.  He was within normal limits.  Vagina is clean, somewhat atrophic with slight rugation.  The cervix is  clean.  Looks nulliparous.  Uterus is small.  Anterior adnexa could not  be palpated.  Attempts at sounding the uterus to an endometrial biopsy  were not successful because of the stenosis.  I was only able to sound  about 1.5 cm so that was aborted and we are sending her first ultrasound  to evaluate the endometrial stripe and decide whether to do a D&C or  not.   IMPRESSION:  Postmenopausal bleeding.  Unable to do endometrial biopsy.           ______________________________  Argentina Donovan, MD     PR/MEDQ  D:  03/01/2007  T:  03/02/2007  Job:  [id number]

## 2010-10-12 NOTE — Group Therapy Note (Signed)
NAME:  Caitlyn Ingram, Caitlyn Ingram NO.:  [id number]   MEDICAL RECORD NO.:  [id number]          PATIENT TYPE:  WOC   LOCATION:  [facility]                   FACILITY:  [facility]   PHYSICIAN:  Jaymes Graff, M.D.    DATE OF BIRTH:  02-11-1956   DATE OF SERVICE:                                  CLINIC NOTE   REASON FOR VISIT:  Annual physical exam and Pap smear.   HISTORY OF PRESENT ILLNESS:  Caitlyn Ingram is a 55 year old G-2, P-2, 0,  0, 2 female who has been menopausal for the past five years, who comes  in today for her routine Pap smear and physical exam.  She has been  doing well.  She is up-to-date on  her mammogram.  She got it earlier  this month and her last Pap smear was in September 2008, and was normal.  She states she has never had an abnormal Pap smear.   PAST MEDICAL HISTORY:  1. Diet-controlled hypercholesterolemia.  2. Borderline hypertension.   PAST SURGICAL HISTORY:  She has had a BTL.   PAST OB HISTORY:  Vaginal delivery x2.   PAST GYN HISTORY:  Diagnosed with uterine fibroids approximately one  year ago, no longer causing her pain or bleeding.  She denies any  history of abnormal Pap smears or sexually transmitted disease.   FAMILY HISTORY:  There is no family history of breast cancer, uterine  cancer, cervical cancer or colon cancer.   SOCIAL HISTORY:  She is married.  Denies alcohol or drugs but does  smoke.   REVIEW OF SYSTEMS:  Only positive for some constipation and gas pain.   ALLERGIES:  VICODIN, IODINE AND FLAGYL, which makes her nauseated if she  takes it by mouth; however, has used MetroGel in the past for various  instances of bacterial vaginosis without problems.   PHYSICAL EXAMINATION:  GENERAL:  She is an alert, well-developed and  well-nourished female, in no acute distress.  HEENT:  Extraocular movements intact.  Mucous membranes moist.  Oropharynx clear.  NECK:  Supple, nontender.  No thyromegaly, no thyroid masses  appreciated.   No lymphadenopathy.  CARDIOVASCULAR:  A regular rate and rhythm without murmur, rub or  gallop.  LUNGS:  Clear to auscultation bilaterally with normal work of breathing.  BREASTS:  Pendulous bilaterally.  No retractions or dimpling, no nipple  discharge and no suspicious lumps or masses appreciated on exam.  ABDOMEN:  Soft, nontender, nondistended, with positive bowel sounds.  GENITOURINARY:  Normal female external genitalia.  Normal introitus.  Vagina pink and moist with normal-appearing rugae.  Cervix is  multiparous and visualized in the midline without any obvious lesion.  Pap smear obtained without friability or hemorrhage.  BIMANUAL EXAM:  Revealed nontender uterus, normal size.  Adnexa could  not be appreciated.  EXTREMITIES:  No clubbing, cyanosis or edema.  SKIN:  No suspicious lesions.   ASSESSMENT/PLAN:  This is a 55 year old post-menopausal female, here for  her routine annual examination.  1. For her routine exam a Pap smear was obtained today.  The patient      was informed she will be  notified of the results by mail.  2. Some vaginal discharge was seen.  The patient states that it has      been similar to what she has seen in the past when she has had      bacterial vaginosis, but she denied any burning, itching or odor.      A wet prep was sent.  The patient preferred to wait for the results      of this wet prep before receiving a prescription for the MetroGel,      as it is very expensive, but does help when needed.     ______________________________  Ardeen Garland, MD    ______________________________  Jaymes Graff, M.D.    LM/MEDQ  D:  03/28/2008  T:  03/28/2008  Job:  [id number]

## 2010-10-15 NOTE — Group Therapy Note (Signed)
NAME:  Caitlyn Ingram, THIER NO.:  [id number]   MEDICAL RECORD NO.:  [id number]          PATIENT TYPE:  EMS   LOCATION:  [facility]                   FACILITY:  [facility]   PHYSICIAN:  Johnella Moloney, MD        DATE OF BIRTH:  01-02-1956   DATE OF SERVICE:                                  CLINIC NOTE   The patient is a 55 year old with 2 episodes of postmenopausal bleeding  that occurred about 2 months ago.  The patient was last seen in this  clinic on October 2.  During this examination, an endometrial biopsy was  attempted but could not be done secondary to stenosis.  The decision was  made to send the patient for an ultrasound.  During the ultrasound, the  patient did have a 3-mm endometrial stripe, but also submucosal  intrauterine mass measuring 1.3 x 1.1 x 1.1 cm which they thought to be  consistent with a submucosal fibroid.  The patient had normal adnexa and  no other findings.  These results were discussed with the patient today.  She was told with an endometrial stripe of 3 mm it was very unlikely  that she has a neoplasia, however, it could not be ruled out.  She was  also told that her bleeding could be as a result of her submucosal  fibroid, however, in any woman who is post menopausal and having  bleeding, that she would need evaluation of her endometrium, and get a  tissue diagnosis.  The patient was counseled, again, reattempting an  endometrial biopsy after using Premarin cream for 2 weeks to see if this  could alleviate the cervical stenosis, but the patient declined this  plan.  The patient also declined obtaining a D&C, stating that she was  no longer satisfied with this clinic, and wanted to go back to her  physician, her former GYN, who is Dr. Stefano Gaul, in order to continue  this care.  The patient reports that she now has insurance and she wants  to have continuity Natali Lavallee/private physician as she is not satisfied  with meeting with different providers in  this clinic.  She was told that  she was welcome to do that, and that we would be able to provide her  records to transfer to Dr. Debria Garret office.  Of note, the patient  reports no further post menopausal bleeding since the episodes 2 months  ago and reports no other GYN symptoms.           ______________________________  Johnella Moloney, MD     UD/MEDQ  D:  03/15/2007  T:  03/16/2007  Job:  [id number]

## 2010-10-15 NOTE — Group Therapy Note (Signed)
Caitlyn Ingram, DYKMAN NO.:  [id number]   MEDICAL RECORD NO.:  [id number]          PATIENT TYPE:  WOC   LOCATION:  [facility]                   FACILITY:  [facility]   PHYSICIAN:  Elsie Lincoln, MD      DATE OF BIRTH:  Oct 19, 1955   DATE OF SERVICE:  10/19/2005                                    CLINIC NOTE   Patient is a 55 year old G2, P2-0-0-2 female menopausal for the past three  years who presents for Pap smear.  Patient had gotten care at __________  [place] in the past.  She had also gotten some care at the outpatient  clinic internal medicine.  She has never gotten care here before.  The  patient has no complaints.  She has had bacterial vaginosis in the past,  does not believe she has it currently; however, she would like to have a wet  prep today.  She is sexually active with her husband and has no dyspareunia.  She occasionally has leakage of fluid for a leakage of urine; however, it is  not a problem for her.  She is able to hold her urine most of the time.  Last Pap smear was approximately two years ago and her last mammogram was  approximately a year ago.   PAST MEDICAL HISTORY:  1.  She has hypercholesterolemia that she controls with diet.  2.  She also has borderline hypertension.   PAST SURGICAL HISTORY:  BTL.   PAST OB HISTORY:  NSVD x2.   PAST GYN HISTORY:  History of an ovarian cyst approximately a dime sized  diagnosed February 2007.  History of bacterial vaginosis.  No history of  abnormal Paps, sexually transmitted diseases, or fibroid tumors of the  uterus.   FAMILY HISTORY:  Daughter has lymphoma.  No history of breast cancer,  uterine cancer, cervical cancer, colon cancer.   SOCIAL HISTORY:  Patient smokes.  Denies alcohol, caffeinated beverages,  sexual abuse, or drugs.   SYSTEMIC REVIEW:  Positive for occasional hot flash, but does not bother  her.   ALLERGIES:  VICODIN, IODINE, and FLAGYL makes her nauseous.   Temperature 97.8,  pulse 100, blood pressure 142/87, weight 107.8,  height 5  feet 6 inches.  General:  Well-developed, well-nourished, no apparent  distress.  HEENT:  Positive for dentures.  Neck:  Supple.  No masses.  Chest:  Clear to auscultation bilaterally.  Heart:  Regular rate and rhythm.  Breasts:  No masses or skin changes, nipple discharge, or lymphadenopathy.  Abdomen soft, nontender, nondistended.  No organomegaly or hernias.  Genitalia Tanner V.  Vagina:  Signs of atrophy.  Cervix closed, nontender.  Uterus anteverted, small, nontender.  Adnexa no masses, nontender.  Rectovaginal:  No masses.  Urethra:  No prolapse or tenderness.  Bladder  well suspended, no tenderness.  Rectum:  No hemorrhoids.  Extremities:  No  edema.   ASSESSMENT/PLAN:  A 55 year old female for Pap smear and well examination.  1.  Pap smear.  2.  Wet prep.  3.  Mammogram scholarship information and order.  4.  Stool kit sent home.  5.  Calcium plus vitamin D 1500 mg a day.  6.  Return to clinic in one year or p.r.n.           ______________________________  Elsie Lincoln, MD     KL/MEDQ  D:  10/19/2005  T:  10/20/2005  Job:  [id number]

## 2010-11-10 ENCOUNTER — Other Ambulatory Visit: Payer: Self-pay | Admitting: Internal Medicine

## 2011-03-08 LAB — BASIC METABOLIC PANEL
CO2: 25
Chloride: 108
Creatinine, Ser: 0.63
GFR calc non Af Amer: >60
Potassium: 3.7
Sodium: 143

## 2011-03-08 LAB — POCT CARDIAC MARKERS: Troponin i, poc: <0.05

## 2011-03-08 LAB — CBC
HCT: 38.1
Hemoglobin: 13
MCHC: 34.1
WBC: 5.3

## 2011-03-15 LAB — POCT RAPID STREP A: Streptococcus, Group A Screen (Direct): NEGATIVE

## 2011-04-01 ENCOUNTER — Encounter: Payer: Self-pay | Admitting: Internal Medicine

## 2011-07-21 ENCOUNTER — Ambulatory Visit (INDEPENDENT_AMBULATORY_CARE_PROVIDER_SITE_OTHER): Payer: Self-pay | Admitting: Physician Assistant

## 2011-07-21 ENCOUNTER — Encounter: Payer: Self-pay | Admitting: Physician Assistant

## 2011-07-21 VITALS — BP 129/84 | HR 112 | Temp 97.1°F | Ht 65.0 in | Wt 174.2 lb

## 2011-07-21 DIAGNOSIS — N760 Acute vaginitis: Secondary | ICD-10-CM

## 2011-07-21 NOTE — Patient Instructions (Signed)
General Instructions for Vaginal Infections Vaginitis is a term to describe many common vaginal infections. These infections may be due to an imbalance of normal germs (bacteria) that exist in the vagina. Many others are caused by sexually transmitted diseases (STD's). If any medication was prescribed to treat your specific infection, it is very important that you take the medication as directed. Your caregiver may want to examine and treat your sex partner. CAUSES  The vagina normally contains organisms (bacteria and yeast) in a balance. Certain factors can disturb this balance and cause an infection, such as:  Sexual intercourse.   Nursing.   Pregnancy.   Menopause.   Hormone changes in the body.   Antibiotics.   Infection elsewhere in your body.   Birth control pills or patches.   Douches.   Spermicides.   Medical illnesses (diabetes).  SYMPTOMS  Different types of vaginal infections cause symptoms such as:  Itching.   Pain or burning.   Bad odor.   Pain or bleeding with sexual intercourse.   Redness of the vulva.   Abnormal discharge (yellow, green, heavy white and thick).   Fever.   A sore on the vulva or vagina.   Urinary symptoms (painful or bloody urine).   Pelvic and/or abdominal pain.   Rectal bleeding, discharge or pain.  DIAGNOSIS   Your caregiver will base the diagnosis upon the symptoms that you report.   A complete history of your sex life may be taken   You may have a pelvic exam.   A sample of your vaginal fluid and/or discharge will be examined under the microscope.   Cultures will help complete the exact diagnosis.  TREATMENT  Treatment depends on the cause of your vaginitis. Your treatment may include a medicine that kills germs (antibiotic). The antibiotic may be a shot, a pill, and/or vaginal suppository or cream. It is not uncommon for more than one type of infection to be present. If more than one infection is present, two or more  medications may be required. Reoccurrence of vaginal infections may be treated with vaginal suppositories or vaginal cream 2 times a week, or as directed. If your caregiver finds that an STD exists, treatment of your sexual partner(s) is important. This is especially important for those infected with chlamydia, gonorrhea, trichomoniasis, bacterial vaginosis, syphilis and HIV infections. Treating sexual partners will prevent you from being re-infected and help stop the spread of STD infection to others. Although it is best to see a specialist for STD/HIV testing and counseling, this is not always possible. Some states/provinces permit something called "expedited partner therapy." This kind of program permits you to deliver prescription(s) to a partner without the partner having to seek a formal medical exam.  HOME CARE INSTRUCTIONS   Take all prescribed medication.   If applicable, speak to your partner about recommended treatment.   Do not have sexual intercourse for one week, or as directed by your caregiver.   Practice safe sex.   Use condoms.   Have only one sex partner.   Make sure your sex partner does not have any other sex partners.   Avoid tight pants and panty hose.   Wear cotton underwear.   Do not douche.   Avoid tampons, especially scented ones.   Take warm sitz baths.   Avoid vaginal sprays and perfumed soaps or bath oils.   Apply medicated cream (steroid cream) for itching or irritation with the permission of your caregiver.  SEEK MEDICAL CARE IF:     You have any kind of abnormal vaginal discharge.   Your sex partner has a genital infection.   You have pain or bleeding with sexual intercourse.   You have itching, pain, irritation or bleeding of the vulva.  SEEK IMMEDIATE MEDICAL CARE IF:   You have an oral temperature above 102 F (38.9 C), not controlled by medicine.   You have belly (abdominal) pain.   Symptoms do not improve within 3 days or as directed.    You develop painful or bloody urine.   You develop rectal pain, bleeding or discharge.  Document Released: 02/23/2005 Document Revised: 01/26/2011 Document Reviewed: 10/09/2008 Exit[employer] Patient Information 2012 [employer]. 

## 2011-07-21 NOTE — Progress Notes (Addendum)
  Subjective:    Patient ID: Caitlyn Ingram, female    DOB: April 01, 1956, 56 y.o.   MRN: [id number]  HPI  1. ?Vaginal infection. Patient states she has had recurrent vaginal bacterial infections. Noted an odor several days ago. Used an applicator to dispense an unknown cream into vagina which she had left over from a previous treatment. After using this she noted some scant blood on the plastic applicator and has some brownish discharge daily. Is married and sexually active but uncertain if husband has been exposed to other partners. Pt is menopausal and last period was 10 years ago. Last pap in 12/2009 was normal, never had abnormal testing.    Review of Systems Denies nausea, vomiting, fever. Has intermittent mild abdominal pain.    Objective:   Physical Exam  Vitals reviewed. Constitutional: She is oriented to person, place, and time. She appears well-developed and well-nourished. No distress.  HENT:  Head: Normocephalic and atraumatic.  Eyes: EOM are normal.  Abdominal: Soft. Bowel sounds are normal. She exhibits no distension. There is no tenderness.  Genitourinary: Vaginal discharge found.       WHite-yellow discharge. Cervix irritated, slightly friable. No cervical motion tenderness.   Neurological: She is alert and oriented to person, place, and time.      Assessment & Plan:   1. Vaginitis  Wet prep, genital, GC/chlamydia probe amp, genital

## 2011-07-21 NOTE — Progress Notes (Signed)
Addended by: Durwin Reges on: 07/21/2011 04:38 PM   Modules accepted: Orders

## 2011-07-21 NOTE — Assessment & Plan Note (Addendum)
Exam showing evidence of cervicitis, likely the cause of her scant bleeding. Have sent wet prep, GC/Chl swab. F/u with results.  Advised avoidance of perfumed soaps to maintain vaginal pH. Discussed early follow up if bleeding persists in this postmenopausal stage. Normal pap last year, will defer until next year.

## 2011-07-22 LAB — WET PREP, GENITAL

## 2011-07-22 LAB — GC/CHLAMYDIA PROBE AMP, GENITAL: GC Probe Amp, Genital: NEGATIVE

## 2011-07-28 ENCOUNTER — Other Ambulatory Visit: Payer: Self-pay | Admitting: Internal Medicine

## 2011-07-28 DIAGNOSIS — Z1231 Encounter for screening mammogram for malignant neoplasm of breast: Secondary | ICD-10-CM

## 2011-08-12 ENCOUNTER — Encounter: Payer: Self-pay | Admitting: Internal Medicine

## 2011-08-18 ENCOUNTER — Encounter: Payer: Self-pay | Admitting: Internal Medicine

## 2011-08-19 ENCOUNTER — Encounter: Payer: Self-pay | Admitting: Internal Medicine

## 2011-08-19 ENCOUNTER — Ambulatory Visit (INDEPENDENT_AMBULATORY_CARE_PROVIDER_SITE_OTHER): Payer: Self-pay | Admitting: Internal Medicine

## 2011-08-19 VITALS — BP 130/78 | HR 91 | Temp 98.6°F | Ht 64.0 in | Wt 176.1 lb

## 2011-08-19 DIAGNOSIS — Z8709 Personal history of other diseases of the respiratory system: Secondary | ICD-10-CM

## 2011-08-19 DIAGNOSIS — I10 Essential (primary) hypertension: Secondary | ICD-10-CM

## 2011-08-19 DIAGNOSIS — Z0000 Encounter for general adult medical examination without abnormal findings: Secondary | ICD-10-CM

## 2011-08-19 DIAGNOSIS — L500 Allergic urticaria: Secondary | ICD-10-CM

## 2011-08-19 DIAGNOSIS — J309 Allergic rhinitis, unspecified: Secondary | ICD-10-CM

## 2011-08-19 DIAGNOSIS — E785 Hyperlipidemia, unspecified: Secondary | ICD-10-CM

## 2011-08-19 HISTORY — DX: Personal history of other diseases of the respiratory system: Z87.09

## 2011-08-19 MED ORDER — PRAVASTATIN SODIUM 20 MG PO TABS: 20.0000 mg | ORAL_TABLET | Freq: Every day | ORAL | Status: DC

## 2011-08-19 MED ORDER — HYDROCHLOROTHIAZIDE 25 MG PO TABS: 25.0000 mg | ORAL_TABLET | Freq: Every day | ORAL | Status: DC

## 2011-08-19 MED ORDER — ALBUTEROL SULFATE HFA 108 (90 BASE) MCG/ACT IN AERS: 2.0000 | INHALATION_SPRAY | Freq: Four times a day (QID) | RESPIRATORY_TRACT | Status: DC | PRN

## 2011-08-19 MED ORDER — LORATADINE 10 MG PO TABS: 10.0000 mg | ORAL_TABLET | Freq: Every day | ORAL | Status: AC

## 2011-08-19 NOTE — Patient Instructions (Addendum)
It was nice to meet you today, Caitlyn Ingram. I changed your allergy prescription to Claritin and renewed your prescription for blood pressure and cholesterol medications. You will also be given Hemoccult cards that should be returned to the clinic. As we discussed, please try to cut down your smoking. This will be a big benefit for you. 1-800-Quit-Now has resources available such as nicotine patches. We will also check your potassium today. If we need to adjust your medicines we will call you. Followup in 1 year.

## 2011-08-19 NOTE — Assessment & Plan Note (Signed)
Will change from Zyrtec to Claritin when necessary.

## 2011-08-19 NOTE — Assessment & Plan Note (Signed)
Controlled today with blood pressure 134/70 with pulse of 91 we'll continue hydrochlorothiazide 25 mg daily and check potassium and kidney function today.

## 2011-08-19 NOTE — Assessment & Plan Note (Signed)
Without shortness of breath today. No reports of recent dyspnea. Patient reports family history of asthma, her siblings and mother. Requesting albuterol inhaler. Although no diagnoses of asthma COPD noted in her records. 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 Will prescribe albuterol inhaler for her to have on hand as I cannot assess a diagnosis of asthma at this point.

## 2011-08-19 NOTE — Progress Notes (Signed)
Subjective:     Patient ID: Caitlyn Ingram, female   DOB: 09/16/1955, 56 y.o.   MRN: [id number]  HPI Patient's 56 year old [place] American female who presents for medication refills. She has no complaints today other than requesting a change in her allergy medication. She reports that she doesn't think the Zyrtec is working for her anymore to decrease the occasional hives that she experiences.   Review of Systems  Constitutional: Negative for fatigue.       Breakout in hives from seasonal allergies per pt report  HENT: Negative for congestion and rhinorrhea.   Respiratory: Negative for shortness of breath.   Cardiovascular: Negative for chest pain.  Genitourinary: Negative for dysuria and hematuria.  Musculoskeletal: Negative for arthralgias.  Neurological: Negative for weakness.  Psychiatric/Behavioral: Negative for dysphoric mood.       Objective:   Physical Exam  Constitutional: She is oriented to person, place, and time. She appears well-developed and well-nourished. No distress.  HENT:  Head: Normocephalic and atraumatic.  Eyes: Conjunctivae and EOM are normal. Pupils are equal, round, and reactive to light.  Neck: Normal range of motion. Neck supple.  Cardiovascular: Normal rate, regular rhythm, normal heart sounds and intact distal pulses.   Pulmonary/Chest: Effort normal and breath sounds normal. She has no wheezes.  Abdominal: Soft. Bowel sounds are normal.  Musculoskeletal: Normal range of motion. She exhibits no edema.  Neurological: She is alert and oriented to person, place, and time.  Skin: Skin is warm and dry.  Psychiatric: She has a normal mood and affect.       Assessment:     1. Hypertension: controlled an at goal 2. Hyperlipidemia: on pravastatin 3. Allergic urticaria: Zyrtec no longer effective 4. H/o bronchitis: reports frequent coughing spells when she catches a cold and has been diagnosed with bronchitis in the past treated with albuterol inhaler,  requesting prescription to have inhaler on hand bc often cannot afford to come to the clinic due to self-pay    Plan:     See problem list

## 2011-08-19 NOTE — Assessment & Plan Note (Addendum)
On pravastatin 20 mg daily. No reports of myalgias, GI upset or muscle cramps. Will check lipid panel today.

## 2011-08-20 ENCOUNTER — Telehealth: Payer: Self-pay | Admitting: Internal Medicine

## 2011-08-20 LAB — LIPID PANEL
Cholesterol: 291 mg/dL — ABNORMAL HIGH (ref 0–200)
HDL: 59 mg/dL (ref >39)
LDL Cholesterol: 189 mg/dL — ABNORMAL HIGH (ref 0–99)
Total CHOL/HDL Ratio: 4.9 Ratio
Triglycerides: 213 mg/dL — ABNORMAL HIGH (ref <150)
VLDL: 43 mg/dL — ABNORMAL HIGH (ref 0–40)

## 2011-08-20 LAB — COMPREHENSIVE METABOLIC PANEL
ALT: 10 U/L (ref 0–35)
AST: 20 U/L (ref 0–37)
Albumin: 3.9 g/dL (ref 3.5–5.2)
Alkaline Phosphatase: 97 U/L (ref 39–117)
BUN: 14 mg/dL (ref 6–23)
CO2: 30 mEq/L (ref 19–32)
Calcium: 10 mg/dL (ref 8.4–10.5)
Chloride: 100 mEq/L (ref 96–112)
Creat: 0.86 mg/dL (ref 0.50–1.10)
Glucose, Bld: 91 mg/dL (ref 70–99)
Potassium: 3.3 mEq/L — ABNORMAL LOW (ref 3.5–5.3)
Sodium: 140 mEq/L (ref 135–145)
Total Bilirubin: 0.3 mg/dL (ref 0.3–1.2)
Total Protein: 6.9 g/dL (ref 6.0–8.3)

## 2011-08-20 NOTE — Telephone Encounter (Signed)
Lipid Panel return with findings of LDL 189 on pravastatin 20 mg daily. Pt with financial hardship but doesn't qualify for [facility] Card thus reluctant to present to clinic unless urgent or needing refills.  Plan: -Will increase pravastatin to 40 mg qd so that she may continue on $4 plan   -Consider switching to alternative statin if still not at goal on next lipid panel  -prescribe K-Dur 20 mEq qd for mild hypokalemia, likely secondary to diuretic use  Lipid Panel     Component Value Date/Time   CHOL 291* 08/19/2011 1651   TRIG 213* 08/19/2011 1651   HDL 59 08/19/2011 1651   CHOLHDL 4.9 08/19/2011 1651   VLDL 43* 08/19/2011 1651   LDLCALC 189* 08/19/2011 1651   CMP     Component Value Date/Time   NA 140 08/19/2011 1651   NA 142 03/23/2009   K 3.3* 08/19/2011 1651   CL 100 08/19/2011 1651   CO2 30 08/19/2011 1651   GLUCOSE 91 08/19/2011 1651   BUN 14 08/19/2011 1651   BUN 9 03/23/2009   CREATININE 0.86 08/19/2011 1651   CREATININE 0.73 03/23/2009 2059   CREATININE 0.7 03/23/2009   CALCIUM 10.0 08/19/2011 1651   PROT 6.9 08/19/2011 1651   ALBUMIN 3.9 08/19/2011 1651   AST 20 08/19/2011 1651   ALT 10 08/19/2011 1651   ALKPHOS 97 08/19/2011 1651   BILITOT 0.3 08/19/2011 1651   GFRNONAA >60 04/23/2007 1220   GFRAA  Value: >60        The eGFR has been calculated using the MDRD equation. This calculation has not been validated in all clinical 04/23/2007 1220

## 2011-08-26 ENCOUNTER — Ambulatory Visit (HOSPITAL_COMMUNITY): Payer: Self-pay

## 2011-09-20 ENCOUNTER — Ambulatory Visit (HOSPITAL_COMMUNITY)
Admission: RE | Admit: 2011-09-20 | Discharge: 2011-09-20 | Disposition: A | Discharge status: Home / Self Care | Payer: Self-pay | Source: Ambulatory Visit | Attending: Internal Medicine | Admitting: Internal Medicine

## 2011-09-20 DIAGNOSIS — Z1231 Encounter for screening mammogram for malignant neoplasm of breast: Secondary | ICD-10-CM

## 2012-09-07 ENCOUNTER — Other Ambulatory Visit: Payer: Self-pay | Admitting: Internal Medicine

## 2012-09-07 DIAGNOSIS — Z1231 Encounter for screening mammogram for malignant neoplasm of breast: Secondary | ICD-10-CM

## 2012-09-15 ENCOUNTER — Emergency Department (INDEPENDENT_AMBULATORY_CARE_PROVIDER_SITE_OTHER)
Admission: EM | Admit: 2012-09-15 | Discharge: 2012-09-15 | Disposition: A | Discharge status: Home / Self Care | Payer: Self-pay | Source: Home / Self Care | Attending: Emergency Medicine | Admitting: Emergency Medicine

## 2012-09-15 ENCOUNTER — Encounter (HOSPITAL_COMMUNITY): Payer: Self-pay | Admitting: Emergency Medicine

## 2012-09-15 ENCOUNTER — Emergency Department (INDEPENDENT_AMBULATORY_CARE_PROVIDER_SITE_OTHER): Payer: Self-pay

## 2012-09-15 DIAGNOSIS — S8391XA Sprain of unspecified site of right knee, initial encounter: Secondary | ICD-10-CM

## 2012-09-15 DIAGNOSIS — IMO0002 Reserved for concepts with insufficient information to code with codable children: Secondary | ICD-10-CM

## 2012-09-15 NOTE — ED Provider Notes (Signed)
Medical screening examination/treatment/procedure(s) were performed by non-physician practitioner and as supervising physician I was immediately available for consultation/collaboration.  Leslee Home, M.D.  Reuben Likes, MD 09/15/12 [phone]

## 2012-09-15 NOTE — ED Notes (Signed)
Pt c/o right knee inj onset 2 days Reports falling on wet floor and inj her right knee Pain is gradually getting worse; pain increases w/activity Denies: head inj/LOC Taking ibup w/little relief  She is alert and oriented w/no signs of acute distress.

## 2012-09-15 NOTE — ED Provider Notes (Signed)
History     CSN: [id number]  Arrival date & time 09/15/12  1101   First MD Initiated Contact with Patient 09/15/12 1109      Chief Complaint  Patient presents with  . Knee Injury    (Consider location/radiation/quality/duration/timing/severity/associated sxs/prior treatment) HPI Comments: In 2 days ago this 57 year old female slipped and fell in a matter and which her legs abducted widely in her right knee struck the ground. She has had progressive pain in the knee over the past 2 days. She is describing as a nagging pain this off and on. It is worse with flexion, extension and ambulation. Denies other injuries.   Past Medical History  Diagnosis Date  . Allergy   . Hyperlipidemia   . Hypertension     History reviewed. No pertinent past surgical history.  Family History  Problem Relation Age of Onset  . Cancer Neg Hx   . Stroke Neg Hx     History  Substance Use Topics  . Smoking status: Current Every Day Smoker -- 0.50 packs/day  . Smokeless tobacco: Not on file     Comment: wans but no Wellbutrin  . Alcohol Use: No    OB History   Grav Para Term Preterm Abortions TAB SAB Ect Mult Living   2 2 2       2       Review of Systems  Constitutional: Negative for fever, chills and activity change.  HENT: Negative.   Respiratory: Negative.   Musculoskeletal:       As per HPI  Skin: Negative for color change, pallor and rash.  Neurological: Negative.     Allergies  Flagyl; Hydrocodone-acetaminophen; and Iodine  Home Medications   Current Outpatient Rx  Name  Route  Sig  Dispense  Refill  . cetirizine (ZYRTEC) 10 MG tablet      TAKE 1 TABLET BY MOUTH EVERY DAY   30 tablet   5   . hydrochlorothiazide (HYDRODIURIL) 25 MG tablet   Oral   Take 1 tablet (25 mg total) by mouth daily.   30 tablet   11   . pravastatin (PRAVACHOL) 20 MG tablet   Oral   Take 1 tablet (20 mg total) by mouth daily.   30 tablet   11     BP 133/80  Pulse 85  Temp(Src) 98.1  F (36.7 C) (Oral)  Resp 18  SpO2 100%  Physical Exam  Nursing note and vitals reviewed. Constitutional: She is oriented to person, place, and time. She appears well-developed and well-nourished. No distress.  HENT:  Head: Atraumatic.  Eyes: EOM are normal.  Neck: Normal range of motion. Neck supple.  Cardiovascular: Normal rate.   Pulmonary/Chest: Effort normal. No respiratory distress.  Musculoskeletal:  Right knee examination reveals minor swelling over the medial joint space. There is also a firm but compressible protrusion of the proximal anterior tibia. Extension is painful and limited to 60 from the 90 angle of sitting. Flexion is limited to 20. Passive range of motion is painful. There is tenderness along the anteromedial aspect of the knee. No deformity. Attempts to perform any maneuvers for testing of specific injuries is met with resistance by the patient. Therefore this is a limited exam. She is able to ambulate and bear full weight.  Neurological: She is alert and oriented to person, place, and time. No cranial nerve deficit. She exhibits normal muscle tone.  Skin: Skin is warm and dry.  Psychiatric: She has a normal mood and affect.  ED Course  Procedures (including critical care time)  Labs Reviewed - No data to display Dg Knee Complete 4 Views Right  09/15/2012  *RADIOLOGY REPORT*  Clinical Data: Fall 2 days ago.  Knee pain.  RIGHT KNEE - COMPLETE 4+ VIEW  Comparison: None.  Findings: Mild patellofemoral osteoarthritis.  No effusion. Anatomic alignment of fracture.  IMPRESSION: Mild patellofemoral osteoarthritis.   Original Report Authenticated By: Andreas Newport, M.D.      1. Knee sprain, right, initial encounter       MDM  Continue to wear the knee immobilizer for the next few days. Call the orthopedist on call on Monday to obtain a followup appointment. In the meantime may apply ice to the front of the knee and limit the amount of ambulation. Keep it  elevated. He take ibuprofen and/or Tylenol for pain. For any worsening new symptoms or problems may return.        Hayden Rasmussen, NP 09/15/12 1212

## 2012-09-20 ENCOUNTER — Ambulatory Visit (HOSPITAL_COMMUNITY)
Admission: RE | Admit: 2012-09-20 | Discharge: 2012-09-20 | Disposition: A | Discharge status: Home / Self Care | Payer: Self-pay | Source: Ambulatory Visit | Attending: Internal Medicine | Admitting: Internal Medicine

## 2012-09-20 DIAGNOSIS — Z1231 Encounter for screening mammogram for malignant neoplasm of breast: Secondary | ICD-10-CM

## 2012-10-29 ENCOUNTER — Encounter: Payer: Self-pay | Admitting: Internal Medicine

## 2012-10-29 ENCOUNTER — Ambulatory Visit (INDEPENDENT_AMBULATORY_CARE_PROVIDER_SITE_OTHER): Payer: Self-pay | Admitting: Internal Medicine

## 2012-10-29 VITALS — BP 138/87 | HR 90 | Temp 99.4°F | Ht 65.0 in | Wt 177.8 lb

## 2012-10-29 DIAGNOSIS — E785 Hyperlipidemia, unspecified: Secondary | ICD-10-CM

## 2012-10-29 DIAGNOSIS — I10 Essential (primary) hypertension: Secondary | ICD-10-CM

## 2012-10-29 DIAGNOSIS — J309 Allergic rhinitis, unspecified: Secondary | ICD-10-CM

## 2012-10-29 DIAGNOSIS — R232 Flushing: Secondary | ICD-10-CM

## 2012-10-29 DIAGNOSIS — E876 Hypokalemia: Secondary | ICD-10-CM

## 2012-10-29 DIAGNOSIS — N951 Menopausal and female climacteric states: Secondary | ICD-10-CM

## 2012-10-29 DIAGNOSIS — Z0000 Encounter for general adult medical examination without abnormal findings: Secondary | ICD-10-CM

## 2012-10-29 DIAGNOSIS — Z720 Tobacco use: Secondary | ICD-10-CM | POA: Insufficient documentation

## 2012-10-29 DIAGNOSIS — F17200 Nicotine dependence, unspecified, uncomplicated: Secondary | ICD-10-CM

## 2012-10-29 DIAGNOSIS — Z8709 Personal history of other diseases of the respiratory system: Secondary | ICD-10-CM

## 2012-10-29 HISTORY — DX: Hypokalemia: E87.6

## 2012-10-29 MED ORDER — HYDROCHLOROTHIAZIDE 25 MG PO TABS: 25.0000 mg | ORAL_TABLET | Freq: Every day | ORAL | Status: DC

## 2012-10-29 MED ORDER — ALBUTEROL SULFATE HFA 108 (90 BASE) MCG/ACT IN AERS: 2.0000 | INHALATION_SPRAY | Freq: Four times a day (QID) | RESPIRATORY_TRACT | Status: DC | PRN

## 2012-10-29 MED ORDER — CETIRIZINE HCL 10 MG PO TABS: ORAL_TABLET | ORAL | Status: DC

## 2012-10-29 NOTE — Progress Notes (Signed)
Subjective:    Patient ID: Caitlyn Ingram, female    DOB: Oct 05, 1955, 57 y.o.   MRN: [id number]  HPI Ms. Laguardia is a 57 year old black female who presents today for followup of her hypertension and hyperlipidemia. She reports that she has not been taking her progress statin secondary to fear of its side effects. He reports compliance with hydrochlorothiazide 25 mg daily She is requesting refill of Zyrtec and albuterol inhaler for her "bronchitis" although she denies any shortness of breath or cough recently and there is no diagnoses of asthma or COPD she has been unable to afford pulmonary function testing nor has there been an acute indication for PFTs.   Review of Systems  Constitutional: Negative for fever and fatigue.  HENT: Negative for congestion and rhinorrhea.   Respiratory: Negative for cough, shortness of breath and wheezing.   Cardiovascular: Negative for chest pain and palpitations.  Gastrointestinal: Negative for diarrhea, constipation and blood in stool.  Endocrine: Positive for heat intolerance.       Hot flashes  Genitourinary: Negative for dysuria and menstrual problem.  Musculoskeletal: Negative for myalgias and arthralgias.  Skin: Positive for rash.       Associated with pollen  Allergic/Immunologic: Positive for environmental allergies.  Neurological: Negative for dizziness, weakness and headaches.       Objective:   Physical Exam  Constitutional: She is oriented to person, place, and time. She appears well-developed and well-nourished. No distress.  Pleasant African American female  HENT:  Head: Normocephalic and atraumatic.  Eyes: Conjunctivae and EOM are normal. Pupils are equal, round, and reactive to light.  Very long false eyelashes  Neck: Normal range of motion. Neck supple. No thyromegaly present.  Cardiovascular: Normal rate, regular rhythm, normal heart sounds and intact distal pulses.   No murmur heard. Pulmonary/Chest: Effort normal. No  respiratory distress. She has no wheezes. She has no rales.  Abdominal: Soft. Bowel sounds are normal. She exhibits no distension.  Musculoskeletal: Normal range of motion. She exhibits no edema and no tenderness.  Neurological: She is alert and oriented to person, place, and time.  Skin: Skin is warm and dry.  Psychiatric: She has a normal mood and affect.          Assessment & Plan:  # 1. Hypertension: Near goal with BP 138/87 today on HCTZ 25 mg daily, patient still smoking cigarettes, reports she will try more diet control  #2 hyperlipidemia: Patient resistant to statin therapy and reports that she has not been compliant with proximal and 20 mg daily. She will for to do diet control and by over-the-counter fish oil tablets. Patient concerned about side effects of statin therapy. We've agreed that she will try diet control for the next 36 months with lipid panel rechecked at that time. If cholesterol still elevated would then proceed to pravastatin. Of note I did provide a cholesterol lowering diet recommendations with her after visit summary. Lipid Panel     Component Value Date/Time   CHOL 291* 08/19/2011 1651   TRIG 213* 08/19/2011 1651   HDL 59 08/19/2011 1651   CHOLHDL 4.9 08/19/2011 1651   VLDL 43* 08/19/2011 1651   LDLCALC 189* 08/19/2011 1651    #3 tobacco abuse: Patient down to 8 cigarettes per day, reinforce of colonic with line and encouraged to continue to cut down.  #4 hypokalemia: Likely secondary to thiazide therapy with potassium 3.3. Patient declines blood potassium recheck and potassium supplementation at this time. She preferrs to eat 1-2 bananas  per day and have her potassium rechecked at followup.  #5 allergic urticaria: Have refilled her prescription for Zyrtec.  #6 health maintenance: Patient is without insurance and is unable to go for colonoscopy. She is provided with multiple Hemoccult cards to be sent in which the patient has not done at this point. Her last  Pap smear was in 2012 (normal) with recommendations per Dr. Cristal Ford of GYN to followup in 2014.

## 2012-10-29 NOTE — Assessment & Plan Note (Signed)
  Assessment: Progress toward smoking cessation:  smoking less Barriers to progress toward smoking cessation:  concern about weight gain;stress Comments: pt states stress cuases her to smoke but she is done to 8 days a day  Plan: Instruction/counseling given:  I counseled patient on the dangers of tobacco use. Educational resources provided:  [employer]) brochure Self management tools provided:    Medications to assist with smoking cessation:  None Patient agreed to the following self-care plans for smoking cessation: go to the [employer] ([email or web address])  Other plans: will continue motivating pt to cut down

## 2012-10-29 NOTE — Assessment & Plan Note (Addendum)
Declines statin therapy.  States that she will try diet control.  Sample diet provided. Counselled re: stroke risks with htn and hyperlipidemia. Lipid Panel     Component Value Date/Time   CHOL 291* 08/19/2011 1651   TRIG 213* 08/19/2011 1651   HDL 59 08/19/2011 1651   CHOLHDL 4.9 08/19/2011 1651   VLDL 43* 08/19/2011 1651   LDLCALC 189* 08/19/2011 1651

## 2012-10-29 NOTE — Assessment & Plan Note (Addendum)
BP Readings from Last 3 Encounters:  10/29/12 138/87  09/15/12 133/80  08/19/11 130/78    Lab Results  Component Value Date   NA 140 08/19/2011   K 3.3* 08/19/2011   CREATININE 0.86 08/19/2011    Assessment: Blood pressure control: controlled Progress toward BP goal:  at goal Comments: forgot to take bp meds today  Plan: Medications:  continue current medications Educational resources provided: brochure;video Self management tools provided: home blood pressure logbook Other plans: will continue to HCTZ 25 mg qd, pt declines blood work today and refuses K supplement, states that she will start daily bananas and banana smoothies; will recheck potassium on next visit

## 2012-10-29 NOTE — Assessment & Plan Note (Signed)
K 3.3 On thiazide therapy. Declines recheck or K supplements.  States that she will eat bananas daily.

## 2012-10-29 NOTE — Patient Instructions (Addendum)
General Instructions: Continue to try and cut down on the cigarettes. I have refilled your allergy and blood pressure medicine, as well as the inhaler. Follow a healthy diet to decrease you cholesterol. I have attached some recommendations to this paperwork. This is especially important since you prefer not to take the cholesterol medicine. In addition, for your low potassium, eating daily bananas will help with this since you prefer not to take the potassium pill. Follow up with Dr. Cristal Ford at [facility] for your hot flashes and PAP Smear follow-ups.   Treatment Goals:  Goals (1 Years of Data) as of 10/29/12         As of Today 09/15/12 08/19/11     Blood Pressure    . Blood Pressure < 140/90  138/87 133/80 130/78      Progress Toward Treatment Goals:  Treatment Goal 10/29/2012  Blood pressure at goal  Stop smoking smoking less  Prevent falls at goal    Self Care Goals & Plans:  Self Care Goal 10/29/2012  Manage my medications take my medicines as prescribed; bring my medications to every visit; refill my medications on time  Eat healthy foods drink diet soda or water instead of juice or soda; eat more vegetables; eat foods that are low in salt; eat baked foods instead of fried foods  Stop smoking go to the [employer] ([email or web address])       Care Management & Community Referrals:  Fat and Cholesterol Control Diet Cholesterol levels in your body are determined significantly by your diet. Cholesterol levels may also be related to heart disease. The following material helps to explain this relationship and discusses what you can do to help keep your heart healthy. Not all cholesterol is bad. Low-density lipoprotein (LDL) cholesterol is the "bad" cholesterol. It may cause fatty deposits to build up inside your arteries. High-density lipoprotein (HDL) cholesterol is "good." It helps to remove the "bad" LDL cholesterol from your blood. Cholesterol is a very important risk  factor for heart disease. Other risk factors are high blood pressure, smoking, stress, heredity, and weight. The heart muscle gets its supply of blood through the coronary arteries. If your LDL cholesterol is high and your HDL cholesterol is low, you are at risk for having fatty deposits build up in your coronary arteries. This leaves less room through which blood can flow. Without sufficient blood and oxygen, the heart muscle cannot function properly and you may feel chest pains (angina pectoris). When a coronary artery closes up entirely, a part of the heart muscle may die causing a heart attack (myocardial infarction). CHECKING CHOLESTEROL When your caregiver sends your blood to a lab to be examined for cholesterol, a complete lipid (fat) profile may be done. With this test, the total amount of cholesterol and levels of LDL and HDL are determined. Triglycerides are a type of fat that circulates in the blood. They can also be used to determine heart disease risk. The list below describes what the numbers should be: Test: Total Cholesterol.  Less than 200 mg/dl. Test: LDL "bad cholesterol."  Less than 100 mg/dl.  Less than 70 mg/dl if you are at very high risk of a heart attack or sudden cardiac death. Test: HDL "good cholesterol."  Greater than 50 mg/dl for women.  Greater than 40 mg/dl for men. Test: Triglycerides.  Less than 150 mg/dl. CONTROLLING CHOLESTEROL WITH DIET Although exercise and lifestyle factors are important, your diet is key. That is because certain foods are known to  raise cholesterol and others to lower it. The goal is to balance foods for their effect on cholesterol and more importantly, to replace saturated and trans fat with other types of fat, such as monounsaturated fat, polyunsaturated fat, and omega-3 fatty acids. On average, a person should consume no more than 15 to 17 g of saturated fat daily. Saturated and trans fats are considered "bad" fats, and they will raise  LDL cholesterol. Saturated fats are primarily found in animal products such as meats, butter, and cream. However, that does not mean you need to give up all your favorite foods. Today, there are good tasting, low-fat, low-cholesterol substitutes for most of the things you like to eat. Choose low-fat or nonfat alternatives. Choose round or loin cuts of red meat. These types of cuts are lowest in fat and cholesterol. Chicken (without the skin), fish, veal, and ground [place] breast are great choices. Eliminate fatty meats, such as hot dogs and salami. Even shellfish have little or no saturated fat. Have a 3 oz (85 g) portion when you eat lean meat, poultry, or fish. Trans fats are also called "partially hydrogenated oils." They are oils that have been scientifically manipulated so that they are solid at room temperature resulting in a longer shelf life and improved taste and texture of foods in which they are added. Trans fats are found in stick margarine, some tub margarines, cookies, crackers, and baked goods.  When baking and cooking, oils are a great substitute for butter. The monounsaturated oils are especially beneficial since it is believed they lower LDL and raise HDL. The oils you should avoid entirely are saturated tropical oils, such as coconut and palm.  Remember to eat a lot from food groups that are naturally free of saturated and trans fat, including fish, fruit, vegetables, beans, grains (barley, rice, couscous, bulgur wheat), and pasta (without cream sauces).  IDENTIFYING FOODS THAT LOWER CHOLESTEROL  Soluble fiber may lower your cholesterol. This type of fiber is found in fruits such as apples, vegetables such as broccoli, potatoes, and carrots, legumes such as beans, peas, and lentils, and grains such as barley. Foods fortified with plant sterols (phytosterol) may also lower cholesterol. You should eat at least 2 g per day of these foods for a cholesterol lowering effect.  Read package labels  to identify low-saturated fats, trans fat free, and low-fat foods at the supermarket. Select cheeses that have only 2 to 3 g saturated fat per ounce. Use a heart-healthy tub margarine that is free of trans fats or partially hydrogenated oil. When buying baked goods (cookies, crackers), avoid partially hydrogenated oils. Breads and muffins should be made from whole grains (whole-wheat or whole oat flour, instead of "flour" or "enriched flour"). Buy non-creamy canned soups with reduced salt and no added fats.  FOOD PREPARATION TECHNIQUES  Never deep-fry. If you must fry, either stir-fry, which uses very little fat, or use non-stick cooking sprays. When possible, broil, bake, or roast meats, and steam vegetables. Instead of putting butter or margarine on vegetables, use lemon and herbs, applesauce, and cinnamon (for squash and sweet potatoes), nonfat yogurt, salsa, and low-fat dressings for salads.  LOW-SATURATED FAT / LOW-FAT FOOD SUBSTITUTES Meats / Saturated Fat (g)  Avoid: Steak, marbled (3 oz/85 g) / 11 g  Choose: Steak, lean (3 oz/85 g) / 4 g  Avoid: Hamburger (3 oz/85 g) / 7 g  Choose: Hamburger, lean (3 oz/85 g) / 5 g  Avoid: Ham (3 oz/85 g) / 6 g  Choose: Ham, lean cut (3 oz/85 g) / 2.4 g  Avoid: Chicken, with skin, dark meat (3 oz/85 g) / 4 g  Choose: Chicken, skin removed, dark meat (3 oz/85 g) / 2 g  Avoid: Chicken, with skin, light meat (3 oz/85 g) / 2.5 g  Choose: Chicken, skin removed, light meat (3 oz/85 g) / 1 g Dairy / Saturated Fat (g)  Avoid: Whole milk (1 cup) / 5 g  Choose: Low-fat milk, 2% (1 cup) / 3 g  Choose: Low-fat milk, 1% (1 cup) / 1.5 g  Choose: Skim milk (1 cup) / 0.3 g  Avoid: Hard cheese (1 oz/28 g) / 6 g  Choose: Skim milk cheese (1 oz/28 g) / 2 to 3 g  Avoid: Cottage cheese, 4% fat (1 cup) / 6.5 g  Choose: Low-fat cottage cheese, 1% fat (1 cup) / 1.5 g  Avoid: Ice cream (1 cup) / 9 g  Choose: Sherbet (1 cup) / 2.5 g  Choose: Nonfat  frozen yogurt (1 cup) / 0.3 g  Choose: Frozen fruit bar / trace  Avoid: Whipped cream (1 tbs) / 3.5 g  Choose: Nondairy whipped topping (1 tbs) / 1 g Condiments / Saturated Fat (g)  Avoid: Mayonnaise (1 tbs) / 2 g  Choose: Low-fat mayonnaise (1 tbs) / 1 g  Avoid: Butter (1 tbs) / 7 g  Choose: Extra light margarine (1 tbs) / 1 g  Avoid: Coconut oil (1 tbs) / 11.8 g  Choose: Olive oil (1 tbs) / 1.8 g  Choose: Corn oil (1 tbs) / 1.7 g  Choose: Safflower oil (1 tbs) / 1.2 g  Choose: Sunflower oil (1 tbs) / 1.4 g  Choose: Soybean oil (1 tbs) / 2.4 g  Choose: Canola oil (1 tbs) / 1 g Document Released: 05/16/2005 Document Revised: 08/08/2011 Document Reviewed: 11/04/2010 [employer] Patient Information 2014 [place], [place].

## 2012-11-02 NOTE — Progress Notes (Signed)
TEACHING ATTENDING ADDENDUM: I discussed this case with Dr. Schooler at the time of the patient visit. I agree with the HPI, exam findings and have read the documentation provided by the resident,  and I concur with the plan of care. Please see the resident note for details of management.    

## 2013-04-11 ENCOUNTER — Encounter: Payer: Self-pay | Admitting: Internal Medicine

## 2013-04-22 ENCOUNTER — Encounter: Payer: Self-pay | Admitting: Internal Medicine

## 2013-05-13 ENCOUNTER — Encounter: Payer: Self-pay | Admitting: Internal Medicine

## 2013-07-01 ENCOUNTER — Encounter: Payer: Self-pay | Admitting: Internal Medicine

## 2013-07-01 ENCOUNTER — Ambulatory Visit (INDEPENDENT_AMBULATORY_CARE_PROVIDER_SITE_OTHER): Payer: No Typology Code available for payment source | Admitting: Internal Medicine

## 2013-07-01 VITALS — BP 130/80 | HR 83 | Temp 98.1°F | Ht 65.0 in | Wt 176.8 lb

## 2013-07-01 DIAGNOSIS — J309 Allergic rhinitis, unspecified: Secondary | ICD-10-CM

## 2013-07-01 DIAGNOSIS — E785 Hyperlipidemia, unspecified: Secondary | ICD-10-CM

## 2013-07-01 DIAGNOSIS — F17200 Nicotine dependence, unspecified, uncomplicated: Secondary | ICD-10-CM

## 2013-07-01 DIAGNOSIS — Z0000 Encounter for general adult medical examination without abnormal findings: Secondary | ICD-10-CM | POA: Insufficient documentation

## 2013-07-01 DIAGNOSIS — Z720 Tobacco use: Secondary | ICD-10-CM

## 2013-07-01 DIAGNOSIS — E876 Hypokalemia: Secondary | ICD-10-CM

## 2013-07-01 DIAGNOSIS — I10 Essential (primary) hypertension: Secondary | ICD-10-CM

## 2013-07-01 HISTORY — DX: Encounter for general adult medical examination without abnormal findings: Z00.00

## 2013-07-01 LAB — BASIC METABOLIC PANEL
BUN: 12 mg/dL (ref 6–23)
CO2: 30 mEq/L (ref 19–32)
Calcium: 9.4 mg/dL (ref 8.4–10.5)
Chloride: 98 mEq/L (ref 96–112)
Creat: 0.64 mg/dL (ref 0.50–1.10)
Glucose, Bld: 85 mg/dL (ref 70–99)
POTASSIUM: 3.2 meq/L — AB (ref 3.5–5.3)
SODIUM: 137 meq/L (ref 135–145)

## 2013-07-01 LAB — LIPID PANEL
CHOL/HDL RATIO: 5.1 ratio
Cholesterol: 247 mg/dL — ABNORMAL HIGH (ref 0–200)
HDL: 48 mg/dL (ref >39)
LDL CALC: 170 mg/dL — AB (ref 0–99)
Triglycerides: 145 mg/dL (ref <150)
VLDL: 29 mg/dL (ref 0–40)

## 2013-07-01 MED ORDER — TRIAMCINOLONE ACETONIDE 55 MCG/ACT NA AERO: 1.0000 | INHALATION_SPRAY | Freq: Two times a day (BID) | NASAL | Status: DC

## 2013-07-01 MED ORDER — HYDROCHLOROTHIAZIDE 25 MG PO TABS: 25.0000 mg | ORAL_TABLET | Freq: Every day | ORAL | Status: DC

## 2013-07-01 NOTE — Patient Instructions (Signed)
General Instructions: It was nice to see you today. We will schedule your colonoscopy and [facility]t [facility] We will check your cholesterol and potassium today. If it is elevated we will need you to start taking the pravastatin and possibly a potassium pill. If you change your mind about the flu shot, let the clinic know. Return to see me in 6 months.   Treatment Goals:  Goals (1 Years of Data) as of 07/01/13         As of Today 10/29/12 09/15/12 08/19/11     Blood Pressure    . Blood Pressure < 140/90  130/80 138/87 133/80 130/78      Progress Toward Treatment Goals:  Treatment Goal 07/01/2013  Blood pressure at goal  Stop smoking smoking less  Prevent falls -    Self Care Goals & Plans:  Self Care Goal 07/01/2013  Manage my medications take my medicines as prescribed; bring my medications to every visit; refill my medications on time; follow the sick day instructions if I am sick  Eat healthy foods eat more vegetables; eat fruit for snacks and desserts; eat baked foods instead of fried foods; eat foods that are low in salt; eat smaller portions; drink diet soda or water instead of juice or soda  Be physically active find an activity I enjoy  Stop smoking -    No flowsheet data found.   Care Management & Community Referrals:  No flowsheet data found.

## 2013-07-01 NOTE — Progress Notes (Signed)
   Subjective:    Patient ID: Caitlyn Ingram, female    DOB: 08/19/1955, 58 y.o.   MRN: [id number]  HPI  Presents for general f/u of hypertension and hyperlipidemia.  Is still smoking less than 1/2 ppd. Has not started  Statin therapy but comes today fasting anticipating better lipid panel outcomes.  Complaints of "sinus problems" lately but has not used the Flonase because she didn't think that it would work and it was costly. Was to return to see GYN for completion of pelvic/Pap which she has not done.  States that she has been using yogurt in the vaginal area instead of the prescribed Flagyl.  Explained to pt that testing revealed likely bacterial vaginosis which will not be cured with yogurt.  Pt also reports occasion speck of blood vaginally and relates that to "inserting" the yogurt to far into the vaginal canal. Wants to lose 10 lbs.  Review of Systems  Constitutional: Negative.   HENT: Positive for rhinorrhea and sinus pressure. Negative for congestion.   Eyes: Negative.   Respiratory: Negative.   Cardiovascular: Negative.   Gastrointestinal: Negative.   Genitourinary:       "speck of blood' vaginally on one or two occasions  Neurological: Negative.   Hematological: Negative.   Psychiatric/Behavioral: Negative.        Objective:   Physical Exam  Constitutional: She is oriented to person, place, and time. She appears well-developed and well-nourished.  HENT:  Head: Normocephalic and atraumatic.  Right Ear: External ear normal.  Left Ear: External ear normal.  Nose: Rhinorrhea present. No sinus tenderness. Right sinus exhibits no maxillary sinus tenderness and no frontal sinus tenderness. Left sinus exhibits no maxillary sinus tenderness and no frontal sinus tenderness.  Mouth/Throat: Uvula is midline, oropharynx is clear and moist and mucous membranes are normal.  Eyes: Conjunctivae and EOM are normal. Pupils are equal, round, and reactive to light.  Neck: Normal range of  motion. Neck supple.  Cardiovascular: Normal rate, regular rhythm, normal heart sounds and intact distal pulses.   No murmur heard. Pulmonary/Chest: Effort normal and breath sounds normal.  Abdominal: Soft. Bowel sounds are normal.  Genitourinary:  Pt declined  Musculoskeletal: Normal range of motion. She exhibits no edema.  Neurological: She is alert and oriented to person, place, and time.  Skin: Skin is warm and dry.  Psychiatric: She has a normal mood and affect.          Assessment & Plan:  See problem-list charting:

## 2013-07-03 ENCOUNTER — Other Ambulatory Visit: Payer: Self-pay | Admitting: Internal Medicine

## 2013-07-03 DIAGNOSIS — E876 Hypokalemia: Secondary | ICD-10-CM

## 2013-07-03 MED ORDER — POTASSIUM CHLORIDE ER 10 MEQ PO TBCR: 40.0000 meq | EXTENDED_RELEASE_TABLET | Freq: Every day | ORAL | Status: DC

## 2013-07-03 NOTE — Assessment & Plan Note (Signed)
BP Readings from Last 3 Encounters:  07/01/13 130/80  10/29/12 138/87  09/15/12 133/80    Lab Results  Component Value Date   NA 137 07/01/2013   K 3.2* 07/01/2013   CREATININE 0.64 07/01/2013    Assessment: Blood pressure control: controlled Progress toward BP goal:  at goal Comments:   Plan: Medications:  continue current medications Educational resources provided: brochure;handout Self management tools provided:   Other plans: cont to monitor, check BMET

## 2013-07-03 NOTE — Assessment & Plan Note (Signed)
States Flonase was too expense, will try OTC Nasacort and cont generic Zyrtec

## 2013-07-03 NOTE — Assessment & Plan Note (Addendum)
Has not started Pravastatin, ATP 10 yr risk 22%

## 2013-07-03 NOTE — Assessment & Plan Note (Signed)
Check K today, on thiazide.  Pt reports eating bananas daily to prevent low K

## 2013-07-03 NOTE — Assessment & Plan Note (Signed)
To f/u with Dr. Marice Potterove of GYN for PAP, declines flu shot and tdap, will refer for colonoscopy in March when pt's insurance is active

## 2013-07-03 NOTE — Assessment & Plan Note (Signed)
  Assessment: Progress toward smoking cessation:  smoking less Barriers to progress toward smoking cessation:  stress Comments:   Plan: Instruction/counseling given:  I counseled patient on the dangers of tobacco use, advised patient to stop smoking, and reviewed strategies to maximize success. Educational resources provided:  [employer], [employer](1-800-QUIT-NOW) brochure Self management tools provided:    Medications to assist with smoking cessation:  None Patient agreed to the following self-care plans for smoking cessation:    Other plans: cont to counsel

## 2013-07-04 NOTE — Progress Notes (Signed)
Case discussed with Dr. Schooler soon after the resident saw the patient.  We reviewed the resident's history and exam and pertinent patient test results.  I agree with the assessment, diagnosis, and plan of care documented in the resident's note. 

## 2013-07-08 ENCOUNTER — Telehealth: Payer: Self-pay | Admitting: *Deleted

## 2013-07-08 NOTE — Telephone Encounter (Signed)
I talked with Dr Bosie ClosSchooler and meds have been ordered. Pt called and informed of lab results and please take pravastatin and Kcl as ordered

## 2013-07-08 NOTE — Telephone Encounter (Signed)
Pt called asking for her lab results from Last week.  She wants to know if she needs to continue cholesterol meds. Cholesterol 247, LDL 170 and K 3.2 (L)  Please call pt.

## 2013-08-15 ENCOUNTER — Encounter: Payer: Self-pay | Admitting: Obstetrics & Gynecology

## 2013-08-15 NOTE — Addendum Note (Signed)
Addended by: Neomia DearPOWERS, Rayder Sullenger E on: 08/15/2013 05:41 PM   Modules accepted: Orders

## 2013-09-03 ENCOUNTER — Other Ambulatory Visit: Payer: Self-pay | Admitting: Internal Medicine

## 2013-09-03 DIAGNOSIS — Z1231 Encounter for screening mammogram for malignant neoplasm of breast: Secondary | ICD-10-CM

## 2013-09-20 ENCOUNTER — Encounter: Payer: Self-pay | Admitting: Internal Medicine

## 2013-09-20 ENCOUNTER — Ambulatory Visit (INDEPENDENT_AMBULATORY_CARE_PROVIDER_SITE_OTHER): Payer: No Typology Code available for payment source | Admitting: Internal Medicine

## 2013-09-20 VITALS — BP 117/80 | HR 90 | Temp 97.4°F | Ht 65.0 in | Wt 178.5 lb

## 2013-09-20 DIAGNOSIS — M7918 Myalgia, other site: Secondary | ICD-10-CM | POA: Insufficient documentation

## 2013-09-20 DIAGNOSIS — I10 Essential (primary) hypertension: Secondary | ICD-10-CM

## 2013-09-20 DIAGNOSIS — IMO0001 Reserved for inherently not codable concepts without codable children: Secondary | ICD-10-CM

## 2013-09-20 DIAGNOSIS — R82998 Other abnormal findings in urine: Secondary | ICD-10-CM

## 2013-09-20 DIAGNOSIS — K219 Gastro-esophageal reflux disease without esophagitis: Secondary | ICD-10-CM

## 2013-09-20 DIAGNOSIS — R8290 Unspecified abnormal findings in urine: Secondary | ICD-10-CM | POA: Insufficient documentation

## 2013-09-20 HISTORY — DX: Unspecified abnormal findings in urine: R82.90

## 2013-09-20 HISTORY — DX: Myalgia, other site: M79.18

## 2013-09-20 MED ORDER — OMEPRAZOLE 40 MG PO CPDR: 40.0000 mg | DELAYED_RELEASE_CAPSULE | Freq: Every day | ORAL | Status: DC

## 2013-09-20 NOTE — Progress Notes (Signed)
Subjective:   Patient ID: Caitlyn Ingram female   DOB: 05/03/1956 58 y.o.   MRN: [id number]  HPI: [name] is a 58 y.o. woman with PMH significant for HTN, HLD comes to the office with CC of left lateral flank pain for the past 3 days and is concerned that she might have "Bladder infection".  Patient denies any dysuria, urinary frequency, hematuria, foul smelling urine, pain in the abdomen/ back, fever, nausea. 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 While she was lifting the kids, she thinks she might have a "pulled a muscle" 3 days back. Pain is constant, occasionally aggravated by bending, relieved by ibuprofen. Patient reports that she has been having "acid reflux" ever since she started taking the ibuprofen and is asking for a medication to help with the acid reflux. She denies any radiation of the pain, tingling or numbness of the lower extremities. She denies any worsening of pain upon deep inspiration or deep expiration or on coughing.  She denies any other complaints.    Past Medical History  Diagnosis Date  . Allergy   . Hyperlipidemia   . Hypertension    Current Outpatient Prescriptions  Medication Sig Dispense Refill  . albuterol (PROVENTIL HFA;VENTOLIN HFA) 108 (90 BASE) MCG/ACT inhaler Inhale 2 puffs into the lungs every 6 (six) hours as needed for wheezing.  1 Inhaler  2  . cetirizine (ZYRTEC) 10 MG tablet TAKE 1 TABLET BY MOUTH EVERY DAY  30 tablet  5  . hydrochlorothiazide (HYDRODIURIL) 25 MG tablet Take 1 tablet (25 mg total) by mouth daily.  30 tablet  11  . potassium chloride (K-DUR) 10 MEQ tablet Take 4 tablets (40 mEq total) by mouth daily.  30 tablet  6  . pravastatin (PRAVACHOL) 20 MG tablet Take 1 tablet (20 mg total) by mouth daily.  30 tablet  11  . triamcinolone (NASACORT AQ) 55 MCG/ACT AERO nasal inhaler Place 1 spray into the nose 2 (two) times daily.  1 Inhaler  2   No current facility-administered  medications for this visit.   Family History  Problem Relation Age of Onset  . Cancer Neg Hx   . Stroke Neg Hx    History   Social History  . Marital Status: Single    Spouse Name: N/A    Number of Children: N/A  . Years of Education: N/A   Social History Main Topics  . Smoking status: Current Every Day Smoker -- 0.50 packs/day  . Smokeless tobacco: None     Comment: wans but no Wellbutrin/ CUT BACK TO 1/2 PPD  . Alcohol Use: No  . Drug Use: No  . Sexual Activity: None   Other Topics Concern  . None   Social History Narrative  . None   Review of Systems: Pertinent items are noted in HPI. Objective:  Physical Exam: Filed Vitals:   09/20/13 1022  BP: 117/80  Pulse: 90  Temp: 97.4 F (36.3 C)  TempSrc: Oral  Height: 5\' 5"  (1.651 m)  Weight: 178 lb 8 oz (80.967 kg)  SpO2: 99%  Constitutional: Vital signs reviewed.  Patient is a well-developed and well-nourished and is in no acute distress and cooperative with exam. Alert and oriented x3.  Cardiovascular: RRR, S1 normal, S2 normal, no MRG, pulses symmetric and intact bilaterally Pulmonary/Chest: normal respiratory effort, CTAB, no wheezes, rales, or rhonchi Abdominal: Soft. Non-tender, non-distended, bowel sounds are normal, no masses, organomegaly,  or guarding present.  GU: no CVA tenderness Musculoskeletal: There is no tenderness to palpation over the thoracis, lumbar, sacral vertebral bodies. There is mild tenderness to palpation over the left lateral aspect of the chest along the lower rib cage. No erythema.  Neurological: A&O x3. Non-focal neuro exam.   Assessment & Plan:

## 2013-09-20 NOTE — Assessment & Plan Note (Signed)
Pain and tenderness located over the left lateral aspect of the chest along the lower rib cage. Symptoms not consistent with Intercostal muscle strain, hence suspect latissmus dorsi muscular strain.  Plans: Continue Ibuprofen as needed for pain. You may use Icy hot patches for pain relief. Use Prilosec as needed while taking Ibuprofen. Start local warm compresses 3-4 times daily. Follow up as needed.

## 2013-09-20 NOTE — Patient Instructions (Signed)
Take Ibuprofen as needed for pain. Use warm water compresses in the place where the pain is located 3-4 times daily. You may use Icy Hot patches available over the counter for pain relief. Use Prilosec as directed.

## 2013-09-20 NOTE — Assessment & Plan Note (Signed)
Patient with no symptoms of UTI or Pyelo. Urine dip stick shows large LE, negative nitrites. Patient is concerned that her back pain is related to "Bladder infection"  Plans: Check U/A, Urine culture. Follow up.

## 2013-09-20 NOTE — Progress Notes (Signed)
INTERNAL MEDICINE TEACHING ATTENDING ADDENDUM - Prentice Sackrider, MD: I reviewed and discussed at the time of visit with the resident Dr. Boggala, the patient's medical history, physical examination, diagnosis and results of tests and treatment and I agree with the patient's care as documented. 

## 2013-09-20 NOTE — Assessment & Plan Note (Signed)
Well controlled.  Plans: Continue current regimen. 

## 2013-09-21 LAB — URINALYSIS, COMPLETE
CRYSTALS: NONE SEEN
Casts: NONE SEEN
Glucose, UA: NEGATIVE mg/dL
Hgb urine dipstick: NEGATIVE
NITRITE: NEGATIVE
Protein, ur: NEGATIVE mg/dL
SPECIFIC GRAVITY, URINE: 1.028 (ref 1.005–1.030)
UROBILINOGEN UA: 1 mg/dL (ref 0.0–1.0)
pH: 5.5 (ref 5.0–8.0)

## 2013-09-22 LAB — URINE CULTURE
COLONY COUNT: NO GROWTH
ORGANISM ID, BACTERIA: NO GROWTH

## 2013-09-24 ENCOUNTER — Ambulatory Visit (HOSPITAL_COMMUNITY)
Admission: RE | Admit: 2013-09-24 | Discharge: 2013-09-24 | Disposition: A | Discharge status: Home / Self Care | Payer: No Typology Code available for payment source | Source: Ambulatory Visit | Attending: Internal Medicine | Admitting: Internal Medicine

## 2013-09-24 DIAGNOSIS — Z1231 Encounter for screening mammogram for malignant neoplasm of breast: Secondary | ICD-10-CM | POA: Insufficient documentation

## 2013-10-01 ENCOUNTER — Other Ambulatory Visit: Payer: Self-pay | Admitting: Gastroenterology

## 2013-10-28 ENCOUNTER — Other Ambulatory Visit: Payer: Self-pay | Admitting: Gastroenterology

## 2013-10-28 NOTE — Addendum Note (Signed)
Addended by: Avni Traore on: 10/28/2013 02:29 PM   Modules accepted: Orders  

## 2013-10-30 ENCOUNTER — Encounter (HOSPITAL_COMMUNITY)
Admission: RE | Disposition: A | Discharge status: Home / Self Care | Payer: Self-pay | Source: Ambulatory Visit | Attending: Gastroenterology

## 2013-10-30 ENCOUNTER — Ambulatory Visit (HOSPITAL_COMMUNITY)
Admission: RE | Admit: 2013-10-30 | Discharge: 2013-10-30 | Disposition: A | Discharge status: Home / Self Care | Payer: No Typology Code available for payment source | Source: Ambulatory Visit | Attending: Gastroenterology | Admitting: Gastroenterology

## 2013-10-30 ENCOUNTER — Encounter (HOSPITAL_COMMUNITY): Payer: Self-pay | Admitting: *Deleted

## 2013-10-30 DIAGNOSIS — K6289 Other specified diseases of anus and rectum: Secondary | ICD-10-CM | POA: Insufficient documentation

## 2013-10-30 HISTORY — PX: EUS: SHX5427

## 2013-10-30 HISTORY — DX: Personal history of other diseases of the digestive system: Z87.19

## 2013-10-30 SURGERY — ULTRASOUND, LOWER GI TRACT, ENDOSCOPIC
Anesthesia: Moderate Sedation

## 2013-10-30 MED ORDER — MIDAZOLAM HCL 10 MG/2ML IJ SOLN
INTRAMUSCULAR | Status: AC
  Filled 2013-10-30: qty 4

## 2013-10-30 MED ORDER — MIDAZOLAM HCL 10 MG/2ML IJ SOLN
INTRAMUSCULAR | Status: DC | PRN
  Administered 2013-10-30 (×2): 1 mg via INTRAVENOUS
  Administered 2013-10-30 (×2): 2 mg via INTRAVENOUS
  Administered 2013-10-30: 1 mg via INTRAVENOUS
  Administered 2013-10-30: 2 mg via INTRAVENOUS
  Administered 2013-10-30: 1 mg via INTRAVENOUS

## 2013-10-30 MED ORDER — SODIUM CHLORIDE 0.9 % IV SOLN
INTRAVENOUS | Status: DC
  Administered 2013-10-30: 500 mL via INTRAVENOUS

## 2013-10-30 MED ORDER — FENTANYL CITRATE 0.05 MG/ML IJ SOLN
INTRAMUSCULAR | Status: DC | PRN
  Administered 2013-10-30 (×3): 25 ug via INTRAVENOUS

## 2013-10-30 MED ORDER — CIPROFLOXACIN IN D5W 400 MG/200ML IV SOLN
400.0000 mg | Freq: Two times a day (BID) | INTRAVENOUS | Status: DC
  Administered 2013-10-30: 400 mg via INTRAVENOUS

## 2013-10-30 MED ORDER — FENTANYL CITRATE 0.05 MG/ML IJ SOLN
INTRAMUSCULAR | Status: AC
  Filled 2013-10-30: qty 2

## 2013-10-30 MED ORDER — CIPROFLOXACIN HCL 500 MG PO TABS
500.0000 mg | ORAL_TABLET | Freq: Two times a day (BID) | ORAL | Status: DC
Start: 2013-10-30 — End: 2014-08-11

## 2013-10-30 MED ORDER — CIPROFLOXACIN IN D5W 400 MG/200ML IV SOLN
INTRAVENOUS | Status: AC
  Filled 2013-10-30: qty 200

## 2013-10-30 NOTE — Discharge Instructions (Signed)
Flexible Sigmoidoscopy °Your caregiver has ordered a flexible sigmoidoscopy. This is an exam to evaluate your lower colon. In this exam your colon is cleansed and a short fiber optic tube is inserted through your rectum and into your colon. The fiber optic scope (endoscope) is a short bundle of enclosed flexible small glass fibers. It transmits light to the area examined and images from that area to your caregiver. You do not have to worry about glass breakage in the endoscope. Discomfort is usually minimal. Sedatives and pain medications are generally not required. This exam helps to detect tumors (lumps), polyps, inflammation (swelling and soreness), and areas of bleeding. It may also be used to take biopsies. These are small pieces of tissue taken to examine under a microscope. °LET YOUR CAREGIVER KNOW ABOUT: °· Allergies. °· Medications taken including herbs, eye drops, over the counter medications, and creams. °· Use of steroids (by mouth or creams). °· Previous problems with anesthetics or novocaine °· Possibility of pregnancy, if this applies. °· History of blood clots (thrombophlebitis). °· History of bleeding or blood problems. °· Previous surgery. °· Other health problems. °BEFORE THE PROCEDURE °Eat normally the night before the exam. Your caregiver may order a mild enema or laxative the night before. No eating or drinking should occur after midnight until the procedure is completed. A rectal suppository or enemas may be given in the morning prior to your procedure. You will be brought to the examination area in a hospital gown. °You should be present 60 minutes prior to your procedure or as directed.  °AFTER THE PROCEDURE  °· There is sometimes a little blood passed with the first bowel movement. Do not be concerned. Because air is often used during the exam, it is not unusual to pass gas and experience abdominal (belly) cramping. Walking or a warm pack on your abdomen may help with this. Do not sleep  with a heating pad as burns can occur. °· You may resume all normal eating and activities. °· Only take over-the-counter or prescription medicines for pain, discomfort, or fever as directed by your caregiver. Do not use aspirin or blood thinners if a biopsy (tissue sample) was taken. Consult your caregiver for medication usage if biopsies were taken. °· Call for your results as instructed by your caregiver. Remember, it is your responsibility to obtain the results of your biopsy. Do not assume everything is fine because you do not hear from your caregiver. °SEEK IMMEDIATE MEDICAL CARE IF: °· An oral temperature above 102° F (38.9° C) develops. °· You pass large blood clots or fill a toilet with blood following the procedure. This may also occur 10 to 14 days following the procedure. It is more likely if a biopsy was taken. °· You develop abdominal pain not relieved with medication or that is getting worse rather than better. °Document Released: 05/13/2000 Document Revised: 08/08/2011 Document Reviewed: 02/23/2005 °ExitCare® Patient Information ©2014 [employer]. ° °

## 2013-10-30 NOTE — H&P (Signed)
Patient interval history reviewed.  Patient examined again.  There has been no change from documented H/P dated 10/28/13 (scanned into chart from our office) except as documented above.  Assessment:  1.  Rectal nodule seen on routine colonoscopy.  Endoscopic appearance on the photographs I saw seem most compatible with extrinsic compression outside of the rectal wall, rather than a submucosal-type lesion.  Plan:  1.  Endorectal ultrasound with possible fine needle aspiration. 2.  Risks (bleeding, infection, bowel perforation that could require surgery, sedation-related changes in cardiopulmonary systems), benefits (identification and possible treatment of source of symptoms, exclusion of certain causes of symptoms), and alternatives (watchful waiting, radiographic imaging studies, empiric medical treatment) of endorectal ultrasound with possible biopsies (RUS +/- FNA) were explained to patient/family in detail and patient wishes to proceed.

## 2013-10-30 NOTE — Op Note (Signed)
[facility] [address] [place], [address]   OPERATIVE PROCEDURE REPORT  PATIENT: Caitlyn Ingram, Caitlyn Ingram  MR#: [id number] BIRTHDATE: [birth year]  GENDER: Female ENDOSCOPIST: Willis Modena, MD REFERRED BY:  Charlott Rakes, M.D. PROCEDURE DATE:  10/30/2013 PROCEDURE:   flexible sigmoidoscopy with EUS and fine needle aspiration ASA CLASS:   Class II INDICATIONS:1.  rectal nodule. MEDICATIONS: Fentanyl 100 mcg IV and Versed 10 mg IV, Cipro 400 mg IV  DESCRIPTION OF PROCEDURE:   After the risks benefits and alternatives of the procedure were thoroughly explained, informed consent was obtained.  Throughout the procedure, the patients blood pressure, pulse and oxygen saturations were monitored continuously. Under direct visualization, the radial, followed by linear, echoendoscopes were sequentially introduced through the anus  and advanced to the sigmoid colon .  Water was used as necessary to provide an acoustic interface.  Imaging was obtained at 7.5 and . Upon completion of the imaging, water was removed and the patient was sent to the recovery room in satisfactory condition.    FINDINGS:   In proximal rectum, an area of submucosal lesion versus extrinsic compression was seen.  Normal overlying mucosa.  Lesion on ultrasound measured about 20 x 59mm in size, is round, hypoechoic, and appears to arise from the muscularis mucosa, with intact submucosa and muscularis propria underneath.  No peritumoral adenopathy.  Lesion was biopsied x 4 (2, 25-g needle,one with 5 cc suction; 2, 22-g needle, both with 10cc suction).  ENDOSCOPIC IMPRESSION: Overall constellation of findings most consistent with rectal leiomyoma versus GI stromal tumor (GIST), biopsies obtained.  RECOMMENDATIONS: 1.  Watch for potential complications of procedure. 2.  Ciprofloxacin 500 mg po bid x 5 days. 3.  Await cytology results. 4.  Consider CT scan with contrast for further  characterization and to exclude extraluminal pathology. 5.  Will discuss with Dr. Bosie Clos.   _______________________________ Rosalie DoctorWillis Modena, MD 10/30/2013 9:49 AM   CC:

## 2013-10-31 ENCOUNTER — Encounter (HOSPITAL_COMMUNITY): Payer: Self-pay | Admitting: Gastroenterology

## 2014-03-31 ENCOUNTER — Encounter (HOSPITAL_COMMUNITY): Payer: Self-pay | Admitting: Gastroenterology

## 2014-04-04 ENCOUNTER — Ambulatory Visit: Payer: Self-pay | Admitting: Family Medicine

## 2014-04-16 ENCOUNTER — Encounter: Payer: Self-pay | Admitting: Internal Medicine

## 2014-05-26 ENCOUNTER — Ambulatory Visit (INDEPENDENT_AMBULATORY_CARE_PROVIDER_SITE_OTHER): Payer: No Typology Code available for payment source | Admitting: Obstetrics & Gynecology

## 2014-05-26 ENCOUNTER — Encounter: Payer: Self-pay | Admitting: Obstetrics & Gynecology

## 2014-05-26 VITALS — BP 147/72 | HR 96 | Temp 98.6°F | Ht 65.0 in | Wt 177.9 lb

## 2014-05-26 DIAGNOSIS — Z124 Encounter for screening for malignant neoplasm of cervix: Secondary | ICD-10-CM

## 2014-05-26 DIAGNOSIS — Z01419 Encounter for gynecological examination (general) (routine) without abnormal findings: Secondary | ICD-10-CM

## 2014-05-26 NOTE — Patient Instructions (Signed)
Pap Test A Pap test is a procedure done in a clinic office to evaluate cells that are on the surface of the cervix. The cervix is the lower portion of the uterus and upper portion of the vagina. For some women, the cervical region has the potential to form cancer. With consistent evaluations by your caregiver, this type of cancer can be prevented.  If a Pap test is abnormal, it is most often a result of a previous exposure to human papillomavirus (HPV). HPV is a virus that can infect the cells of the cervix and cause dysplasia. Dysplasia is where the cells no longer look normal. If a woman has been diagnosed with high-grade or severe dysplasia, they are at higher risk of developing cervical cancer. People diagnosed with low-grade dysplasia should still be seen by their caregiver because there is a small chance that low-grade dysplasia could develop into cancer.  LET YOUR CAREGIVER KNOW ABOUT:  Recent sexually transmitted infection (STI) you have had.  Any new sex partners you have had.  History of previous abnormal Pap tests results.  History of previous cervical procedures you have had (colposcopy, biopsy, loop electrosurgical excision procedure [LEEP]).  Concerns you have had regarding unusual vaginal discharge.  History of pelvic pain.  Your use of birth control. BEFORE THE PROCEDURE  Ask your caregiver when to schedule your Pap test. It is best not to be on your period if your caregiver uses a wooden spatula to collect cells or applies cells to a glass slide. Newer techniques are not so sensitive to the timing of a menstrual cycle.  Do not douche or have sexual intercourse for 24 hours before the test.   Do not use vaginal creams or tampons for 24 hours before the test.   Empty your bladder just before the test to lessen any discomfort.  PROCEDURE You will lie on an exam table with your feet in stirrups. A warm metal or plastic instrument (speculum) is placed in your vagina. This  instrument allows your caregiver to see the inside of your vagina and look at your cervix. A small, plastic brush or wooden spatula is then used to collect cervical cells. These cells are placed in a lab specimen container. The cells are looked at under a microscope. A specialist will determine if the cells are normal.  AFTER THE PROCEDURE Make sure to get your test results.If your results come back abnormal, you may need further testing.  Document Released: 08/06/2002 Document Revised: 08/08/2011 Document Reviewed: 05/12/2011 ExitCare Patient Information 2015 [employer]. This information is not intended to replace advice given to you by your health care provider. Make sure you discuss any questions you have with your health care provider.  

## 2014-05-26 NOTE — Progress Notes (Signed)
Patient ID: Mckinley Jewellla E Scronce, female   DOB: 01/25/1956, 58 y.o.   MRN: [id number] Subjective:     Mckinley Jewellla E Blomberg is a 58 y.o. female here for a routine exam.  LMP 10 years prev. Current complaints: Pt with no complaints.  She reports that she was couching but, has to lately.   Pt reports hot flushes that she deal with on her own.  Not interested in meds.   Gynecologic History No LMP recorded. Patient is postmenopausal. Contraception: post menopausal status Last Pap: 12/2009. Results were: normal Last mammogram: 08/2013. Results were: normal Colonoscopy in 08/2013. S/p bx that was benign  Obstetric History OB History  Gravida Para Term Preterm AB SAB TAB Ectopic Multiple Living  2 2 2       2     # Outcome Date GA Lbr Len/2nd Weight Sex Delivery Anes PTL Lv  2 Term           1 Term                The following portions of the patient's history were reviewed and updated as appropriate: allergies, current medications, past family history, past medical history, past social history, past surgical history and problem list.  Review of Systems Pertinent items are noted in HPI.    Objective:    BP 147/72 mmHg  Pulse 96  Temp(Src) 98.6 F (37 C)  Ht 5\' 5"  (1.651 m)  Wt 177 lb 14.4 oz (80.695 kg)  BMI 29.60 kg/m2  General Appearance:    Alert, cooperative, no distress, appears stated age                 Neck:   Supple, symmetrical, trachea midline, no adenopathy;    thyroid:  no enlargement/tenderness/nodules; no carotid   bruit or JVD  Back:     Symmetric, no curvature, ROM normal, no CVA tenderness  Lungs:     Clear to auscultation bilaterally, respirations unlabored  Chest Wall:    No tenderness or deformity   Heart:    Regular rate and rhythm, S1 and S2 normal, no murmur, rub   or gallop  Breast Exam:    No tenderness, masses, or nipple abnormality  Abdomen:     Soft, non-tender, bowel sounds active all four quadrants,    no masses, no organomegaly  Genitalia:    Normal  female without lesion, discharge or tenderness GU: EGBUS: no lesions Vagina: no blood in vault Cervix: no lesion; no mucopurulent d/c Uterus: small, mobile Adnexa: no masses; non tender      Extremities:   Extremities normal, atraumatic, no cyanosis or edema  Pulses:   2+ and symmetric all extremities  Skin:   Skin color, texture, turgor normal, no rashes or lesions            Assessment:    Healthy female exam.   Per pt lesion on colonoscopy that pt did not f/u to have removed as instructed.  She reports that she will f/u in 2 weeks.   Plan:    Follow up in: 1 year.    Pt to f/u with GI doc to remove lesion in colon F/u PAP and HPV

## 2014-05-27 LAB — CYTOLOGY - PAP

## 2014-05-28 ENCOUNTER — Other Ambulatory Visit: Payer: Self-pay | Admitting: Obstetrics & Gynecology

## 2014-05-28 DIAGNOSIS — A599 Trichomoniasis, unspecified: Secondary | ICD-10-CM

## 2014-05-28 MED ORDER — METRONIDAZOLE 500 MG PO TABS: ORAL_TABLET | ORAL | Status: DC

## 2014-05-29 MED ORDER — TINIDAZOLE 500 MG PO TABS: 2.0000 g | ORAL_TABLET | Freq: Once | ORAL | Status: DC

## 2014-05-29 NOTE — Progress Notes (Addendum)
Called pt and informed her that she had trich off her pap.  I advised her that a rx of flagyl was sent to her pharmacy in which I noticed that she was allergic to it.  I informed pt that we can order tindamax 2 grams.  I spoke with Dr. Adrian BlackwaterStinson and he said to order the tindamax since her allergy is N/V.  I advised pt to take the medication at once and please take with food which should be a little easier on the stomach.  Pt stated understanding with no further questions.

## 2014-05-29 NOTE — Addendum Note (Signed)
Addended by: Faythe CasaBELLAMY, Ardine Iacovelli M on: 05/29/2014 10:11 AM   Modules accepted: Orders, Medications

## 2014-06-09 ENCOUNTER — Encounter: Payer: Self-pay | Admitting: Internal Medicine

## 2014-06-23 ENCOUNTER — Telehealth: Payer: Self-pay

## 2014-06-23 NOTE — Telephone Encounter (Signed)
Opened in error

## 2014-07-14 ENCOUNTER — Encounter: Payer: Self-pay | Admitting: Internal Medicine

## 2014-07-21 ENCOUNTER — Encounter: Payer: Self-pay | Admitting: Internal Medicine

## 2014-08-01 ENCOUNTER — Other Ambulatory Visit: Payer: Self-pay | Admitting: *Deleted

## 2014-08-01 DIAGNOSIS — I10 Essential (primary) hypertension: Secondary | ICD-10-CM

## 2014-08-04 MED ORDER — HYDROCHLOROTHIAZIDE 25 MG PO TABS: 25.0000 mg | ORAL_TABLET | Freq: Every day | ORAL | Status: DC

## 2014-08-11 ENCOUNTER — Ambulatory Visit (INDEPENDENT_AMBULATORY_CARE_PROVIDER_SITE_OTHER): Payer: Self-pay | Admitting: Internal Medicine

## 2014-08-11 ENCOUNTER — Encounter: Payer: Self-pay | Admitting: Internal Medicine

## 2014-08-11 VITALS — BP 136/84 | HR 85 | Temp 98.3°F | Ht 65.0 in | Wt 178.2 lb

## 2014-08-11 DIAGNOSIS — E876 Hypokalemia: Secondary | ICD-10-CM

## 2014-08-11 DIAGNOSIS — I10 Essential (primary) hypertension: Secondary | ICD-10-CM

## 2014-08-11 DIAGNOSIS — E785 Hyperlipidemia, unspecified: Secondary | ICD-10-CM

## 2014-08-11 DIAGNOSIS — Z0000 Encounter for general adult medical examination without abnormal findings: Secondary | ICD-10-CM

## 2014-08-11 LAB — BASIC METABOLIC PANEL
BUN: 10 mg/dL (ref 6–23)
CO2: 28 mEq/L (ref 19–32)
Calcium: 9.3 mg/dL (ref 8.4–10.5)
Chloride: 101 mEq/L (ref 96–112)
Creat: 0.69 mg/dL (ref 0.50–1.10)
GLUCOSE: 103 mg/dL — AB (ref 70–99)
POTASSIUM: 3.2 meq/L — AB (ref 3.5–5.3)
Sodium: 140 mEq/L (ref 135–145)

## 2014-08-11 LAB — LIPID PANEL
Cholesterol: 252 mg/dL — ABNORMAL HIGH (ref 0–200)
HDL: 53 mg/dL (ref >=46)
LDL CALC: 172 mg/dL — AB (ref 0–99)
TRIGLYCERIDES: 133 mg/dL (ref <150)
Total CHOL/HDL Ratio: 4.8 Ratio
VLDL: 27 mg/dL (ref 0–40)

## 2014-08-11 MED ORDER — IBUPROFEN 800 MG PO TABS: 800.0000 mg | ORAL_TABLET | Freq: Three times a day (TID) | ORAL | Status: DC | PRN

## 2014-08-11 MED ORDER — CYCLOBENZAPRINE HCL 5 MG PO TABS: 5.0000 mg | ORAL_TABLET | Freq: Every evening | ORAL | Status: DC | PRN

## 2014-08-11 NOTE — Patient Instructions (Signed)
I have prescribed a short course of Flexeril to help with your muscle spasms. Please only take it at night and only when you feel that you needed. Additionally, I will contact you regarding the results of your lipid panel as well as your electrolytes.  General Instructions:   Please bring your medicines with you each time you come to clinic.  Medicines may include prescription medications, over-the-counter medications, herbal remedies, eye drops, vitamins, or other pills.   Progress Toward Treatment Goals:  Treatment Goal 07/01/2013  Blood pressure at goal  Stop smoking smoking less  Prevent falls -    Self Care Goals & Plans:  Self Care Goal 08/11/2014  Manage my medications take my medicines as prescribed; bring my medications to every visit; refill my medications on time  Eat healthy foods drink diet soda or water instead of juice or soda; eat more vegetables; eat foods that are low in salt; eat baked foods instead of fried foods; eat fruit for snacks and desserts  Be physically active -  Stop smoking -    No flowsheet data found.   Care Management & Community Referrals:  No flowsheet data found.

## 2014-08-11 NOTE — Assessment & Plan Note (Addendum)
BP Readings from Last 3 Encounters:  08/11/14 136/84  05/26/14 147/72  10/30/13 125/78    Lab Results  Component Value Date   NA 137 07/01/2013   K 3.2* 07/01/2013   CREATININE 0.64 07/01/2013    Assessment: Blood pressure control: controlled Progress toward BP goal:  at goal Comments: Patient states that she is compliant with her hydrochlorothiazide 25 mg daily.  Plan: Medications:  continue current medications Educational resources provided:   Self management tools provided:   Other plans: Check basic metabolic panel for potassium. May consider hydrochlorothiazide-triamterene in the future if patient continues to be hypokalemic.

## 2014-08-11 NOTE — Assessment & Plan Note (Addendum)
Potassium level of 3.2 from February 2015. This is likely secondary to her hydrochlorothiazide. Patient is prescribed 40 mEq of potassium chloride (K-Dur) supplementation a day. Patient states that she is not compliant with the potassium due to bad taste and the fact that it makes her "feel funny." -Repeat basic metabolic panel today -Consider continued counseling patient regarding the benefit of potassium supplementation in the future. -Consider transition to hydrochlorothiazide-triamterene for potassium sparing qualities should potassium remain low.  Addendum 08/12/14:  Patient with persistently low potassium of 3.2, stable from last year. Upon further telephone discussion with patient, she is agreeable to taking her potassium pills in a crushed form in smoothies and other drinks. Will recheck potassium at next visit.

## 2014-08-11 NOTE — Assessment & Plan Note (Signed)
Patient declining flu vaccine and HIV testing today. She is concerned that the flu vaccine will make her ill and she sees no need for the HIV testing. Patient was counseled on the risk and benefits of both interventions and patient declined.

## 2014-08-11 NOTE — Progress Notes (Signed)
   Subjective:    Patient ID: Caitlyn Ingram, female    DOB: 12/31/1955, 59 y.o.   MRN: [id number]  HPI  Patient is a 59 year old with a history of hypertension, hyperlipidemia, tobacco abuse who presents to clinic for blood pressure follow-up.  Please see problem based charting for more details.  Review of Systems  Constitutional: Negative for fever and chills.  HENT: Negative for rhinorrhea and sore throat.   Eyes: Negative for visual disturbance.  Respiratory: Negative for cough and shortness of breath.   Cardiovascular: Negative for chest pain and palpitations.  Gastrointestinal: Negative for nausea, vomiting, abdominal pain, diarrhea, constipation and blood in stool.  Genitourinary: Negative for dysuria and hematuria.  Musculoskeletal:       Muscle spasms in her right thigh and right gluteal region  Neurological: Negative for syncope.       Objective:   Physical Exam  Constitutional: She is oriented to person, place, and time. She appears well-developed and well-nourished. No distress.  HENT:  Head: Normocephalic and atraumatic.  Eyes: EOM are normal. Pupils are equal, round, and reactive to light. Left eye exhibits no discharge.  Neck: Normal range of motion. Neck supple. No thyromegaly present.  Cardiovascular: Normal rate and regular rhythm.  Exam reveals no gallop and no friction rub.   No murmur heard. Pulmonary/Chest: Effort normal and breath sounds normal. No respiratory distress. She has no wheezes. She has no rales.  Abdominal: Soft. Bowel sounds are normal. She exhibits no distension. There is no tenderness. There is no rebound.  Musculoskeletal: She exhibits no edema.  Neurological: She is alert and oriented to person, place, and time. No cranial nerve deficit.  Skin: Skin is warm and dry. No rash noted.  Psychiatric: She has a normal mood and affect. Thought content normal.          Assessment & Plan:  Please see problem based charting for more details.

## 2014-08-11 NOTE — Assessment & Plan Note (Addendum)
Patient states that she is not taking her pravastatin 20 mg daily because she knows a lot of friends who have gone sick while taking the medication. Patient is amenable to a repeat lipid profile today. -Lipid profile -Patient counseled on the risks and benefits of statins -May consider a different statin in the future should her lipid profile warrant treatment.  Addendum 08/12/14:  - Lipid profile yielding a 10% ASCVD risk, warranting moderate to high intensity statin. Upon further telephone discussion with the patient, she is amenable to starting simvastatin 20 mg instead.

## 2014-08-12 MED ORDER — SIMVASTATIN 20 MG PO TABS: 20.0000 mg | ORAL_TABLET | Freq: Every evening | ORAL | Status: DC

## 2014-08-12 NOTE — Addendum Note (Signed)
Addended by: Harold BarbanNGO, Sharmaine Bain on: 08/12/2014 08:39 PM   Modules accepted: Orders, Medications

## 2014-08-14 ENCOUNTER — Other Ambulatory Visit: Payer: Self-pay | Admitting: Internal Medicine

## 2014-08-14 DIAGNOSIS — E876 Hypokalemia: Secondary | ICD-10-CM

## 2014-08-14 MED ORDER — POTASSIUM CHLORIDE 20 MEQ PO PACK: 20.0000 meq | PACK | Freq: Two times a day (BID) | ORAL | Status: DC

## 2014-08-14 NOTE — Progress Notes (Signed)
Internal Medicine Clinic Attending Date of Visit: 08/11/2014  Case discussed with Dr. Loma NewtonNgo at the time of the visit.  We reviewed the resident's history and exam and pertinent patient test results.  I agree with the assessment, diagnosis, and plan of care documented in the resident's note.

## 2014-08-14 NOTE — Assessment & Plan Note (Signed)
Upon further review, I have changed patient's k-dur to klor-con 20 meq powder BID as this would be a more appropriate therapy for mixing into her drinks. Patient is agreeable to this plan.

## 2014-08-18 ENCOUNTER — Telehealth: Payer: Self-pay | Admitting: *Deleted

## 2014-08-18 NOTE — Telephone Encounter (Signed)
klor-con packets are too expensive, may the pharmacy change to klor-con tablets? Please send a new script

## 2014-08-20 MED ORDER — POTASSIUM CHLORIDE ER 8 MEQ PO TBCR: 16.0000 meq | EXTENDED_RELEASE_TABLET | Freq: Every day | ORAL | Status: DC

## 2014-09-23 ENCOUNTER — Other Ambulatory Visit: Payer: Self-pay | Admitting: Internal Medicine

## 2014-09-23 DIAGNOSIS — Z1231 Encounter for screening mammogram for malignant neoplasm of breast: Secondary | ICD-10-CM

## 2014-10-02 ENCOUNTER — Ambulatory Visit (HOSPITAL_COMMUNITY): Payer: Self-pay

## 2014-10-07 ENCOUNTER — Ambulatory Visit (HOSPITAL_COMMUNITY)
Admission: RE | Admit: 2014-10-07 | Discharge: 2014-10-07 | Disposition: A | Discharge status: Home / Self Care | Payer: Self-pay | Source: Ambulatory Visit | Attending: Internal Medicine | Admitting: Internal Medicine

## 2014-10-07 ENCOUNTER — Encounter: Payer: Self-pay | Admitting: *Deleted

## 2014-10-07 DIAGNOSIS — Z1231 Encounter for screening mammogram for malignant neoplasm of breast: Secondary | ICD-10-CM

## 2014-11-25 ENCOUNTER — Encounter (HOSPITAL_COMMUNITY): Payer: Self-pay | Admitting: *Deleted

## 2014-11-25 ENCOUNTER — Emergency Department (INDEPENDENT_AMBULATORY_CARE_PROVIDER_SITE_OTHER)
Admission: EM | Admit: 2014-11-25 | Discharge: 2014-11-25 | Disposition: A | Discharge status: Home / Self Care | Payer: Self-pay | Source: Home / Self Care | Attending: Family Medicine | Admitting: Family Medicine

## 2014-11-25 DIAGNOSIS — Z041 Encounter for examination and observation following transport accident: Secondary | ICD-10-CM

## 2014-11-25 MED ORDER — CYCLOBENZAPRINE HCL 5 MG PO TABS: 5.0000 mg | ORAL_TABLET | Freq: Three times a day (TID) | ORAL | Status: DC | PRN

## 2014-11-25 MED ORDER — IBUPROFEN 800 MG PO TABS: 800.0000 mg | ORAL_TABLET | Freq: Three times a day (TID) | ORAL | Status: DC
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
## 2015-01-19 ENCOUNTER — Ambulatory Visit: Payer: Self-pay | Admitting: Internal Medicine

## 2015-01-21 ENCOUNTER — Encounter: Payer: Self-pay | Admitting: Internal Medicine

## 2015-01-21 ENCOUNTER — Ambulatory Visit (INDEPENDENT_AMBULATORY_CARE_PROVIDER_SITE_OTHER): Payer: Self-pay | Admitting: Internal Medicine

## 2015-01-21 DIAGNOSIS — J309 Allergic rhinitis, unspecified: Secondary | ICD-10-CM

## 2015-01-21 DIAGNOSIS — M7918 Myalgia, other site: Secondary | ICD-10-CM

## 2015-01-21 DIAGNOSIS — R0981 Nasal congestion: Secondary | ICD-10-CM

## 2015-01-21 DIAGNOSIS — M79604 Pain in right leg: Secondary | ICD-10-CM

## 2015-01-21 DIAGNOSIS — M79605 Pain in left leg: Secondary | ICD-10-CM

## 2015-01-21 MED ORDER — IBUPROFEN 400 MG PO TABS: 400.0000 mg | ORAL_TABLET | Freq: Three times a day (TID) | ORAL | Status: DC | PRN

## 2015-01-21 NOTE — Patient Instructions (Addendum)
Ms. Sakuma it was nice meting you today. I have referred you to ENT doctors.   Medications:  Ibuprofen 400 mg 1 tablet by mouth every 8 hours as needed for pain.   Continue to use over the counter Coricidin as needed for congestion.   Continue to use over the counter Zyrtec as needed for allergies.
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
## 2015-01-22 NOTE — Assessment & Plan Note (Addendum)
Mild bilateral leg pain x 2 months. States she was told it was muscle spasms in her legs and was given Flexeril which did not help. At present complaining of mild pain in her legs but is able to walk and exercise without any difficulty. Pt wanted a letter stating she can return to work. Denies any back pain. Denies any focal neurological symptoms such as weakness, numbness, or tingling. Denies any fecal or urinary incontinence. Pain most likely musculoskeletal in nature.  -Ibuprofen 400 mg q8 prn pain -Work release form given to patient

## 2015-01-22 NOTE — Assessment & Plan Note (Signed)
Hx of chronic nasal congestion, Zyrtec helps. Likely allergic rhinitis. Not likely URI because she does not have any fevers, sneezing, sore throat, or headaches. Physical exam revealed slight tenderness of maxillary sinuses on palpation. No cervical LAD, oropharynx clear. Not likely sinusitis because she does not have any nasal discharge and is afebrile.  -She has a Hx of HTN, as such, advised to use Coricidin decongestant.  -ENT referral because this seems to be chronic problem for her. Suspect anatomic abnormality vs allergies.

## 2015-01-22 NOTE — Assessment & Plan Note (Addendum)
Mild bilateral leg pain x 2 months. States she was told it was muscle spasms in her legs and was given Flexeril which did not help. At present complaining of mild pain in her legs but is able to walk and exercise without any difficulty. Pt wanted a letter stating she can return to work. Denies any back pain. Denies any focal neurological symptoms such as weakness, numbness, or tingling. Denies any fecal or urinary incontinence. Pain most likely musculoskeletal in nature.  -Ibuprofen 400 mg q8 prn pain -Letter to return to work given

## 2015-01-26 NOTE — Progress Notes (Signed)
Internal Medicine Clinic Attending  I saw and evaluated the patient.  I personally confirmed the key portions of the history and exam documented by Dr. Rathore and I reviewed pertinent patient test results.  The assessment, diagnosis, and plan were formulated together and I agree with the documentation in the resident's note.  

## 2015-04-10 ENCOUNTER — Encounter: Payer: Self-pay | Admitting: Internal Medicine

## 2015-04-10 ENCOUNTER — Ambulatory Visit (INDEPENDENT_AMBULATORY_CARE_PROVIDER_SITE_OTHER): Payer: Self-pay | Admitting: Internal Medicine

## 2015-04-10 VITALS — BP 149/82 | HR 82 | Temp 98.2°F | Ht 65.0 in | Wt 171.3 lb

## 2015-04-10 DIAGNOSIS — L500 Allergic urticaria: Secondary | ICD-10-CM | POA: Insufficient documentation

## 2015-04-10 DIAGNOSIS — L299 Pruritus, unspecified: Secondary | ICD-10-CM

## 2015-04-10 HISTORY — DX: Allergic urticaria: L50.0

## 2015-04-10 NOTE — Progress Notes (Signed)
Patient ID: Caitlyn Ingram, female   DOB: 09/04/1955, 59 y.o.   MRN: [id number]    Subjective:   Patient ID: Caitlyn Ingram female   DOB: 09/09/1955 59 y.o.   MRN: [id number]  HPI: [name] is a 59 y.o. woman with PMH noted below here to discuss her itching.  Please see Problem List/A&P for the status of the patient's chronic medical problems      Past Medical History  Diagnosis Date  . Allergy   . Hyperlipidemia   . Hypertension   . H/O hiatal hernia    Current Outpatient Prescriptions  Medication Sig Dispense Refill  . albuterol (PROVENTIL HFA;VENTOLIN HFA) 108 (90 BASE) MCG/ACT inhaler Inhale 2 puffs into the lungs every 6 (six) hours as needed for wheezing. 1 Inhaler 2  . cetirizine (ZYRTEC) 10 MG tablet TAKE 1 TABLET BY MOUTH EVERY DAY (Patient not taking: Reported on 05/26/2014) 30 tablet 5  . cyclobenzaprine (FLEXERIL) 5 MG tablet Take 1 tablet (5 mg total) by mouth 3 (three) times daily as needed for muscle spasms. 30 tablet 0  . hydrochlorothiazide (HYDRODIURIL) 25 MG tablet Take 1 tablet (25 mg total) by mouth daily. 90 tablet 3  . ibuprofen (ADVIL,MOTRIN) 400 MG tablet Take 1 tablet (400 mg total) by mouth every 8 (eight) hours as needed. 20 tablet 0  . Omega-3 Fatty Acids (FISH OIL TRIPLE STRENGTH PO) Take by mouth 3 (three) times a week.    . potassium chloride (KLOR-CON) 8 MEQ tablet Take 2 tablets (16 mEq total) by mouth daily. 60 tablet 1  . simvastatin (ZOCOR) 20 MG tablet Take 1 tablet (20 mg total) by mouth every evening. 30 tablet 11  . triamcinolone (NASACORT AQ) 55 MCG/ACT AERO nasal inhaler Place 1 spray into the nose 2 (two) times daily. (Patient not taking: Reported on 05/26/2014) 1 Inhaler 2   No current facility-administered medications for this visit.   Family History  Problem Relation Age of Onset  . Cancer Neg Hx   . Stroke Neg Hx    Social History   Social History  . Marital Status: Legally Separated    Spouse Name: N/A  . Number of  Children: N/A  . Years of Education: N/A   Social History Main Topics  . Smoking status: Current Every Day Smoker -- 0.50 packs/day  . Smokeless tobacco: None     Comment: Cutting back.  . Alcohol Use: 0.0 oz/week    0 Standard drinks or equivalent per week     Comment: Rarely.  . Drug Use: No  . Sexual Activity: Not Asked   Other Topics Concern  . None   Social History Narrative   Review of Systems: Review of Systems  Constitutional: Negative for fever, chills, weight loss and malaise/fatigue.  HENT: Negative.   Eyes: Negative for blurred vision.  Respiratory: Negative for shortness of breath.   Cardiovascular: Negative for chest pain.  Gastrointestinal: Negative for nausea, vomiting, abdominal pain and diarrhea.  Skin: Positive for itching. Negative for rash.  Neurological: Negative for weakness.  Psychiatric/Behavioral: Negative for substance abuse. The patient is not nervous/anxious.     Objective:  Physical Exam: Filed Vitals:   04/10/15 1430 04/10/15 1528  BP: 150/75 149/82  Pulse: 92 82  Temp: 98.2 F (36.8 C)   TempSrc: Oral   Height: 5\' 5"  (1.651 m)   Weight: 171 lb 4.8 oz (77.701 kg)   SpO2: 100%    Physical Exam  Constitutional: She appears well-developed and  well-nourished.  HENT:  Head: Normocephalic and atraumatic.  Neck: Neck supple.  Cardiovascular: Normal rate, regular rhythm, normal heart sounds and intact distal pulses.   No murmur heard. Pulmonary/Chest: Effort normal and breath sounds normal. No respiratory distress. She has no wheezes. She exhibits no tenderness.  Skin: Skin is warm and dry. No rash noted.    Assessment & Plan:   Please see problem based charting for assessment and plan

## 2015-04-10 NOTE — Assessment & Plan Note (Signed)
Pt says she has pruritus for >3 years- it comes in episodes. The episodes last less than 5 minutes. SHe gets hives and wheals when it happens. Last one was 2 weeks ago, she gets these episodes every few months. She saw another physician who told her it was "allergies"/ Pt has been taking cetirizine, but only occasionally and now says it does not work like it used to  She denies any rash, does not have itching currently.  Pt denies fevers or other systemic symptoms. No travel history, no insect bites, no scabies, other people in the house not affected Pt not clear if she used new detergents or soaps She is allergic to benadryl- it causes her to "fall." She smokes 0.5 ppd > 10 years  Exam was unremarkable  Assessment: allergic urticaria . Other differentials include PV, hodgkins, liver cholestasis but these are highly unlikely.   Plan cetirizine or fexofenadine everyday for 2 weeks to see if it improves RTC in 1 month if it does not improve Counseled on smoking cessation Counseled on trying less irritating soaps or fragrance free detergents

## 2015-04-10 NOTE — Progress Notes (Signed)
Medicine attending: I personally interviewed and briefly examined this patient on the day of the patient visit and reviewed pertinent clinical ,laboratory data  with resident physician Dr. Deneise LeverParth Saraiya and we discussed a management plan.

## 2015-04-10 NOTE — Patient Instructions (Signed)
Please take scheduled zyrtec or allegra (cetirizine or fexofenadine) daily for 2 weeks to see if your itching improves

## 2015-06-22 ENCOUNTER — Other Ambulatory Visit: Payer: Self-pay | Admitting: Internal Medicine

## 2015-09-28 ENCOUNTER — Other Ambulatory Visit: Payer: Self-pay | Admitting: Internal Medicine

## 2015-09-28 NOTE — Telephone Encounter (Signed)
Last visit 04/10/2015. Next appointment 12/11/2015.

## 2015-12-11 ENCOUNTER — Encounter: Payer: Self-pay | Admitting: Internal Medicine

## 2015-12-28 ENCOUNTER — Other Ambulatory Visit: Payer: Self-pay | Admitting: Internal Medicine

## 2015-12-30 ENCOUNTER — Other Ambulatory Visit: Payer: Self-pay | Admitting: Internal Medicine

## 2016-01-15 ENCOUNTER — Encounter: Payer: Self-pay | Admitting: Internal Medicine

## 2016-01-15 ENCOUNTER — Ambulatory Visit (INDEPENDENT_AMBULATORY_CARE_PROVIDER_SITE_OTHER): Payer: BLUE CROSS/BLUE SHIELD | Admitting: Internal Medicine

## 2016-01-15 VITALS — BP 135/65 | HR 90 | Temp 98.3°F | Ht 65.0 in | Wt 174.8 lb

## 2016-01-15 DIAGNOSIS — Z720 Tobacco use: Secondary | ICD-10-CM

## 2016-01-15 DIAGNOSIS — E785 Hyperlipidemia, unspecified: Secondary | ICD-10-CM | POA: Diagnosis not present

## 2016-01-15 DIAGNOSIS — Z0000 Encounter for general adult medical examination without abnormal findings: Secondary | ICD-10-CM

## 2016-01-15 DIAGNOSIS — M7918 Myalgia, other site: Secondary | ICD-10-CM

## 2016-01-15 DIAGNOSIS — E876 Hypokalemia: Secondary | ICD-10-CM

## 2016-01-15 DIAGNOSIS — Z114 Encounter for screening for human immunodeficiency virus [HIV]: Secondary | ICD-10-CM | POA: Diagnosis not present

## 2016-01-15 DIAGNOSIS — M791 Myalgia: Secondary | ICD-10-CM | POA: Diagnosis not present

## 2016-01-15 DIAGNOSIS — I10 Essential (primary) hypertension: Secondary | ICD-10-CM

## 2016-01-15 DIAGNOSIS — F17210 Nicotine dependence, cigarettes, uncomplicated: Secondary | ICD-10-CM

## 2016-01-15 MED ORDER — TRIAMTERENE-HCTZ 37.5-25 MG PO CAPS: 1.0000 | ORAL_CAPSULE | Freq: Every day | ORAL | 11 refills | Status: DC

## 2016-01-15 NOTE — Patient Instructions (Addendum)
It was a pleasure to see you today Caitlyn Ingram.  I have changed your blood pressure medicine today and you hopefully will not need to continue taking potassium pills twice daily. I recommend decreasing to one pill daily for now and we will recheck your blood tests at our next visit in about 2 months.  I am glad to hear you have decreased your cigarette use down to about 10 cigarettes today. This is the most important change you can make to protect your heart.  For your right shoulder I recommend exercising it with arm raises to the front, side, and back 10 times each position, repeated twice daily. This will improve the flexibility and strength of that shoulder as I think the muscles of your upper back are sore due to strain.

## 2016-01-16 LAB — BMP8+ANION GAP
Anion Gap: 19 mmol/L — ABNORMAL HIGH (ref 10.0–18.0)
BUN/Creatinine Ratio: 21 (ref 12–28)
BUN: 11 mg/dL (ref 8–27)
CO2: 24 mmol/L (ref 18–29)
CREATININE: 0.53 mg/dL — AB (ref 0.57–1.00)
Calcium: 9.2 mg/dL (ref 8.7–10.3)
Chloride: 99 mmol/L (ref 96–106)
GFR calc Af Amer: 119 mL/min/{1.73_m2} (ref >59)
GFR calc non Af Amer: 104 mL/min/{1.73_m2} (ref >59)
GLUCOSE: 105 mg/dL — AB (ref 65–99)
Potassium: 3.4 mmol/L — ABNORMAL LOW (ref 3.5–5.2)
SODIUM: 142 mmol/L (ref 134–144)

## 2016-01-16 LAB — HIV ANTIBODY (ROUTINE TESTING W REFLEX): HIV SCREEN 4TH GENERATION: NONREACTIVE

## 2016-01-18 NOTE — Assessment & Plan Note (Signed)
Caitlyn Ingram is aware of her hyperlipidemia which combined with smoking, age, and hypertension puts her at moderately elevated ASCVD risk 10-20%. She is adamant in not wanting statin therapy due to fear of side effects. She wants to continue taking fish oil and modifying diet. I also counseled her that smoking cessation would lower this risk greatly.

## 2016-01-18 NOTE — Assessment & Plan Note (Signed)
She had increased smoking with more life stress, but is again making some progress in controlling it. Currently 1/2 ppd smoking says it is down from 1 ppd at the start of the year. She does not want any pharmacological aids at this time.

## 2016-01-18 NOTE — Assessment & Plan Note (Addendum)
BP Readings from Last 3 Encounters:  01/15/16 135/65  04/10/15 (!) 149/82  11/25/14 151/78    Lab Results  Component Value Date   NA 142 01/15/2016   K 3.4 (L) 01/15/2016   CREATININE 0.53 (L) 01/15/2016    Assessment: Blood pressure control: Controlled Progress toward BP goal:  At goal Comments: BP controlled but has been normal to mildly high on this regimen. She is mildly hypokalemic at 3.4 on 16 mEq daily supplementation  Plan: Medications: Change HCTZ 25mg  to HCTZ-triamterene 25-37.5mg , discontinue daily potassium Other plans: RTC in 2-4 weeks for repeat metabolic panel after changing medicines

## 2016-01-18 NOTE — Assessment & Plan Note (Signed)
She has muscular tenderness and pain over the right shoulder and upper back. Recommended daily arm raises in anterior, neutral, and posterior positions for improving flexibility here.

## 2016-01-18 NOTE — Progress Notes (Signed)
   CC: Follow up for hypertension  HPI:  Caitlyn Ingram is a 60 y.o. with a history of hyperlipidemia, current smoking, seasonal allergies here for hypertension with hypokalemia on her thiazide diuretic. She has been feeling well overall taking her HCTZ and potassium supplements daily. She has not had muscle cramps or weakness at any point. She does not have dizziness or headaches. She continues to smoke but has decreased the rate to 1/2 ppd instead of 1 ppd.  Her problems with allergic rhinitis and urticaria are now much improved since her pervious visit. She continues to take Zyrtec 10mg  daily and thinks this is helping.  See problem based assessment and plan below for additional details.  Past Medical History:  Diagnosis Date  . Allergy   . H/O hiatal hernia   . Hyperlipidemia   . Hypertension     Review of Systems:  Review of Systems  Eyes: Negative for blurred vision.  Respiratory: Negative for shortness of breath.   Cardiovascular: Negative for chest pain and leg swelling.  Gastrointestinal: Negative for diarrhea and nausea.  Neurological: Negative for dizziness, weakness and headaches.  Endo/Heme/Allergies: Positive for environmental allergies.     Physical Exam:  Vitals:   01/15/16 1602  BP: 135/65  Pulse: 90  Temp: 98.3 F (36.8 C)  TempSrc: Oral  Weight: 174 lb 12.8 oz (79.3 kg)  Height: 5\' 5"  (1.651 m)   GENERAL- alert, co-operative, NAD HEENT- Oral mucosa appears moist CARDIAC- RRR, no murmurs, rubs or gallops. RESP- CTAB, no wheezes or crackles. EXTREMITIES- symmetric, no pedal edema. SKIN- Warm, dry, No rash or lesion. PSYCH- Normal mood and affect, appropriate thought content and speech.   Assessment & Plan:   See Encounters Tab for problem based charting.  Patient discussed with Dr. Oswaldo DoneVincent

## 2016-01-18 NOTE — Assessment & Plan Note (Signed)
Patient interested in HIV screening today. She says this is due to having a new sexual partner for the past 8 months and she is not sure about his sexual history. She has never been screened before now so this is appropriate for multiple indications.  HIV screening test negative

## 2016-01-19 NOTE — Progress Notes (Signed)
Internal Medicine Clinic Attending  Case discussed with Dr. Rice soon after the resident saw the patient.  We reviewed the resident's history and exam and pertinent patient test results.  I agree with the assessment, diagnosis, and plan of care documented in the resident's note. 

## 2016-04-11 ENCOUNTER — Other Ambulatory Visit: Payer: Self-pay | Admitting: Internal Medicine

## 2016-05-12 ENCOUNTER — Telehealth: Payer: Self-pay | Admitting: Internal Medicine

## 2016-05-12 NOTE — Telephone Encounter (Signed)
APT. REMINDER CALL, NO ANSWER, NO VOICEMAIL °

## 2016-05-13 ENCOUNTER — Encounter: Payer: Self-pay | Admitting: Internal Medicine

## 2016-05-18 ENCOUNTER — Ambulatory Visit (INDEPENDENT_AMBULATORY_CARE_PROVIDER_SITE_OTHER): Payer: BLUE CROSS/BLUE SHIELD | Admitting: Internal Medicine

## 2016-05-18 ENCOUNTER — Encounter (INDEPENDENT_AMBULATORY_CARE_PROVIDER_SITE_OTHER): Payer: Self-pay

## 2016-05-18 VITALS — BP 143/67 | HR 88 | Temp 98.4°F | Ht 65.0 in | Wt 176.0 lb

## 2016-05-18 DIAGNOSIS — Z79899 Other long term (current) drug therapy: Secondary | ICD-10-CM | POA: Diagnosis not present

## 2016-05-18 DIAGNOSIS — Z0000 Encounter for general adult medical examination without abnormal findings: Secondary | ICD-10-CM

## 2016-05-18 DIAGNOSIS — F17210 Nicotine dependence, cigarettes, uncomplicated: Secondary | ICD-10-CM | POA: Diagnosis not present

## 2016-05-18 DIAGNOSIS — I10 Essential (primary) hypertension: Secondary | ICD-10-CM

## 2016-05-18 DIAGNOSIS — E785 Hyperlipidemia, unspecified: Secondary | ICD-10-CM | POA: Diagnosis not present

## 2016-05-18 MED ORDER — AMLODIPINE BESYLATE 5 MG PO TABS: 5.0000 mg | ORAL_TABLET | Freq: Every day | ORAL | 11 refills | Status: DC

## 2016-05-18 MED ORDER — TRIAMTERENE-HCTZ 37.5-25 MG PO CAPS: 1.0000 | ORAL_CAPSULE | Freq: Every day | ORAL | 11 refills | Status: DC

## 2016-05-18 NOTE — Progress Notes (Signed)
   CC: Blood pressure follow up  HPI:  Ms.Shevaun E Mayford KnifeWilliams is a 60 y.o. female with PMHx detailed below presenting to follow up her blood pressure control with no acute complaints today.  See problem based assessment and plan below for additional details.  Past Medical History:  Diagnosis Date  . Allergy   . H/O hiatal hernia   . Hyperlipidemia   . Hypertension     Review of Systems: Review of Systems  Constitutional: Negative for chills, fever, malaise/fatigue and weight loss.  Respiratory: Negative for cough and shortness of breath.   Cardiovascular: Negative for chest pain and palpitations.  Gastrointestinal: Negative for abdominal pain, constipation, diarrhea, nausea and vomiting.  All other systems reviewed and are negative.    Physical Exam: Vitals:   05/18/16 0934 05/18/16 0935  BP: (!) 153/81 (!) 143/67  Pulse: 87 88  Temp: 98.4 F (36.9 C)   TempSrc: Oral   Weight: 176 lb (79.8 kg)   Height: 5\' 5"  (1.651 m)    Body mass index is 29.29 kg/m. GENERAL- Woman sitting comfortably in exam room chair, alert, in no distress HEENT- Atraumatic, PERRL, moist mucous membranes CARDIAC- Regular rate and rhythm, no murmurs, rubs or gallops. RESP- Clear to ascultation bilaterally, no wheezing or crackles, normal work of breathing ABDOMEN- Soft, nontender, nondistended EXTREMITIES- Normal bulk and range of motion, no edema, 2+ peripheral pulses SKIN- Warm, dry, intact, without visible rash PSYCH- Guarded affect, clear speech, thoughts linear and goal-directed  Assessment & Plan:   See encounters tab for problem based medical decision making.  Patient seen with Dr. Heide SparkNarendra

## 2016-05-18 NOTE — Assessment & Plan Note (Signed)
Offered flu vaccine, patient refused.

## 2016-05-18 NOTE — Patient Instructions (Signed)
Today we prescribed Amlodipine (Norvasc) 5 mg daily to help get your blood pressure under better control.  You should keep taking your Dyazide (HCTZ-triamterene) blood pressure medication that you were previously taking.  We will call if your blood tests are concerning or abnormal.  We are glad to hear that you plan to quit smoking and begin exercising and dieting on New Years!  Take care and happy holiday!

## 2016-05-18 NOTE — Assessment & Plan Note (Addendum)
Has been taking Triamterene-HCTZ 37.5-25 mg daily since last visit. Blood pressure 153/81 initially, and 143/67 on recheck. Given very high ASCVD risk (~28%), unmedicated HLD, and ongoing tobacco use - goal should be closer to 120/80. Also rechecking BMP today to assess potassium level.  Plan: - Follow BMP today - K 3.0, called and discussed with patient - who prefers to continue dietary modification, eating high-potassium foods - Added Amlodipine 5mg  daily - Continue Triamterene-HCTZ 37.5-25 - Encouraged DASH diet, exercise, weight loss, smoking cessation

## 2016-05-18 NOTE — Assessment & Plan Note (Addendum)
Patient reports taking no statin (never took Pravachol previously, even when it was prescribed) and no longer taking fish oil. Advised that with her 28% ASCVD 10 year risk score she has a huge risk for stroke or MI. She is frightened by this but is afraid of statins because of the side effects "people going blind and dying." Discussed that these medications are widely prescribed and well-tolerated. She states that she prefers to work on blood pressure at the moment and will try diet/exercise starting on New Years. Willing to recheck her lipids today and may be willing to try statins in the future.  Plan: - Follow lipid panel results - Ideally start Pravachol 40mg  in future  Addendum:   - Called and communicated poor results of lipid panel, patient opting to continue diet/exercise for now and will consider low dose statin at next visit  Lipid Panel     Component Value Date/Time   CHOL 265 (H) 05/18/2016 1006   TRIG 184 (H) 05/18/2016 1006   HDL 53 05/18/2016 1006   CHOLHDL 5.0 (H) 05/18/2016 1006   CHOLHDL 4.8 08/11/2014 1642   VLDL 27 08/11/2014 1642   LDLCALC 175 (H) 05/18/2016 1006

## 2016-05-19 LAB — BMP8+ANION GAP
Anion Gap: 17 mmol/L (ref 10.0–18.0)
BUN/Creatinine Ratio: 14 (ref 12–28)
BUN: 9 mg/dL (ref 8–27)
CO2: 28 mmol/L (ref 18–29)
Calcium: 9.5 mg/dL (ref 8.7–10.3)
Chloride: 99 mmol/L (ref 96–106)
Creatinine, Ser: 0.63 mg/dL (ref 0.57–1.00)
GFR calc non Af Amer: 97 mL/min/{1.73_m2} (ref >59)
GFR, EST AFRICAN AMERICAN: 112 mL/min/{1.73_m2} (ref >59)
Glucose: 88 mg/dL (ref 65–99)
Potassium: 3 mmol/L — ABNORMAL LOW (ref 3.5–5.2)
SODIUM: 144 mmol/L (ref 134–144)

## 2016-05-19 LAB — LIPID PANEL
CHOL/HDL RATIO: 5 ratio — AB (ref 0.0–4.4)
Cholesterol, Total: 265 mg/dL — ABNORMAL HIGH (ref 100–199)
HDL: 53 mg/dL (ref >39)
LDL Calculated: 175 mg/dL — ABNORMAL HIGH (ref 0–99)
Triglycerides: 184 mg/dL — ABNORMAL HIGH (ref 0–149)
VLDL Cholesterol Cal: 37 mg/dL (ref 5–40)

## 2016-05-19 NOTE — Progress Notes (Signed)
Internal Medicine Clinic Attending  I saw and evaluated the patient.  I personally confirmed the key portions of the history and exam documented by Dr. Johnson and I reviewed pertinent patient test results.  The assessment, diagnosis, and plan were formulated together and I agree with the documentation in the resident's note.  

## 2016-10-04 ENCOUNTER — Ambulatory Visit: Payer: Self-pay

## 2016-10-14 ENCOUNTER — Encounter (INDEPENDENT_AMBULATORY_CARE_PROVIDER_SITE_OTHER): Payer: Self-pay

## 2016-10-14 ENCOUNTER — Ambulatory Visit (HOSPITAL_COMMUNITY)
Admission: RE | Admit: 2016-10-14 | Discharge: 2016-10-14 | Disposition: A | Discharge status: Home / Self Care | Payer: BLUE CROSS/BLUE SHIELD | Source: Ambulatory Visit | Attending: Internal Medicine | Admitting: Internal Medicine

## 2016-10-14 ENCOUNTER — Ambulatory Visit (INDEPENDENT_AMBULATORY_CARE_PROVIDER_SITE_OTHER): Payer: BLUE CROSS/BLUE SHIELD | Admitting: Internal Medicine

## 2016-10-14 ENCOUNTER — Encounter: Payer: Self-pay | Admitting: Internal Medicine

## 2016-10-14 VITALS — BP 135/77 | HR 97 | Temp 98.9°F | Wt 165.3 lb

## 2016-10-14 DIAGNOSIS — J449 Chronic obstructive pulmonary disease, unspecified: Secondary | ICD-10-CM | POA: Diagnosis not present

## 2016-10-14 DIAGNOSIS — R05 Cough: Secondary | ICD-10-CM

## 2016-10-14 DIAGNOSIS — R059 Cough, unspecified: Secondary | ICD-10-CM | POA: Insufficient documentation

## 2016-10-14 DIAGNOSIS — I10 Essential (primary) hypertension: Secondary | ICD-10-CM | POA: Diagnosis not present

## 2016-10-14 HISTORY — DX: Cough, unspecified: R05.9

## 2016-10-14 LAB — BASIC METABOLIC PANEL
Anion gap: 8 (ref 5–15)
BUN: 11 mg/dL (ref 6–20)
CALCIUM: 9.6 mg/dL (ref 8.9–10.3)
CO2: 29 mmol/L (ref 22–32)
CREATININE: 0.81 mg/dL (ref 0.44–1.00)
Chloride: 101 mmol/L (ref 101–111)
GFR calc non Af Amer: >60 mL/min (ref >60)
Glucose, Bld: 106 mg/dL — ABNORMAL HIGH (ref 65–99)
Potassium: 3 mmol/L — ABNORMAL LOW (ref 3.5–5.1)
Sodium: 138 mmol/L (ref 135–145)

## 2016-10-14 MED ORDER — POTASSIUM CHLORIDE 20 MEQ PO PACK: 20.0000 meq | PACK | Freq: Every day | ORAL | 0 refills | Status: DC

## 2016-10-14 MED ORDER — ALBUTEROL SULFATE HFA 108 (90 BASE) MCG/ACT IN AERS: 2.0000 | INHALATION_SPRAY | Freq: Four times a day (QID) | RESPIRATORY_TRACT | 2 refills | Status: DC | PRN

## 2016-10-14 NOTE — Assessment & Plan Note (Addendum)
Patient is here today for blood pressure follow-up. She was last seen in clinic in December 2017 and amlodipine 5 mg daily was added to her regimen. She is also taking triamterene-HCTZ 30 7. 5-25 milligrams daily. She has her medications with her today and reports compliance. Per PCP, blood pressure goal is <120/80 given her high ASCVD risk. Blood pressure today was elevated 160s systolic but came down to 135/77 on recheck. I discussed increasing her amlodipine today, but she is very resistant to the idea of increasing medications or adding new medicines. She is actually requesting to come off of her blood pressure medicines. She reports that her blood pressure is better when she checks it at home. I've asked patient to continue taking both of her medicines for now. I have given her blood pressure log. I've asked patient to check her blood pressure 2-3 times a week. She will follow-up in one month with her log for recheck. -- Continue amlodipine 5 mg daily  -- Continue triamterene-HCTZ 37.5-25 mg daily  -- BMP today  -- F/u 1 month   ADDENDUM: Potassium is persistently low at 3.0. Patient endorsed difficulty swallowing the potassium pills. I have sent a prescription for potassium chloride powder to her pharmacy. 20 mEq daily x 7 days. Follow up 1 week for BMP recheck.

## 2016-10-14 NOTE — Addendum Note (Signed)
Addended by: Burnell BlanksGUILLOUD, Jaylei Fuerte R on: 10/14/2016 04:36 PM   Modules accepted: Orders

## 2016-10-14 NOTE — Patient Instructions (Addendum)
Caitlyn Ingram,  It was a pleasure to see you today. Please go upstairs to the first floor radiology department to have your chest x-ray done. I will call you with the results. Depending on the results, I may need to call in an antibiotic to your pharmacy. Please continue to take your medication as previously prescribed. Please check your blood pressure 2-3 x a week. I would like for you to follow up in 1 month for your blood pressure log. I will call you with the results of your blood work today. If you have any questions or concerns, call our clinic at [phone] or after hours call [phone] and ask for the internal medicine resident on call. Thank you!  - Dr. Antony ContrasGuilloud

## 2016-10-14 NOTE — Assessment & Plan Note (Addendum)
Patient is here today with complaint of cough 1 week. She reports she's been taking over-the-counter Coricidin without relief. She is complaining of coughing that keeps her up at night and is requesting medication. Unfortunately she is allergic to codeine. Reports that she "blacked out" last time she took it. On exam she was wheezing bilaterally and has audible crackles in her left middle lobe. She reports a history of asthma. I have refilled her albuterol inhaler, we will obtain a chest x-ray. Patient may require antibiotics. -- Continue Coricidin -- Albuterol prn -- F/u CXR   ADDENDUM: Attempted to call patient x 2 with no answer, unable to leave voicemail. CXR showed changes consistent with COPD, but no infiltrate or consolidation. If cough does not improve, consider treatment with a z-pack +/- prednisone. Patient will likely benefit from PFTs once recovered from this illness.   ADDENDUM: Finally reached patient on her cell. Cough has now resolved and she is back to her baseline. Informed her of the COPD changes seen on CXR. Patient reports a desire to quit smoking. I encouraged cessation. Informed her that we may need to obtain PFTs in the future. Instructed her to follow up with her PCP. She expressed understanding.

## 2016-10-14 NOTE — Progress Notes (Signed)
   CC: Cough, BP follow up   HPI:  Ms.Kearia E Mayford KnifeWilliams is a 61 y.o. female with past medical history outlined below here for cough and blood pressure follow up. For the details of today's visit, please refer to the assessment and plan.  Past Medical History:  Diagnosis Date  . Allergy   . H/O hiatal hernia   . Hyperlipidemia   . Hypertension     Review of Systems:  All pertinents listed in HPI, otherwise negative  Physical Exam:  Vitals:   10/14/16 1547  BP: 135/77  Pulse: 97  Temp: 98.9 F (37.2 C)  TempSrc: Oral  SpO2: 99%  Weight: 165 lb 4.8 oz (75 kg)    Constitutional: NAD, appears comfortable Cardiovascular: RRR Pulmonary/Chest: Left middle lobe crackles, expiratory wheezing bilaterally  Neurological: A&Ox3, CN II - XII grossly intact.  Skin: No rashes or erythema  Psychiatric: Normal mood and affect  Assessment & Plan:   See Encounters Tab for problem based charting.  Patient discussed with Dr. Criselda PeachesMullen

## 2016-10-17 NOTE — Progress Notes (Signed)
Internal Medicine Clinic Attending  Case discussed with Dr. Guilloud at the time of the visit.  We reviewed the resident's history and exam and pertinent patient test results.  I agree with the assessment, diagnosis, and plan of care documented in the resident's note.  

## 2016-10-26 NOTE — Addendum Note (Signed)
Addended by: Burnell BlanksGUILLOUD, Judy Goodenow R on: 10/26/2016 09:48 AM   Modules accepted: Orders

## 2016-10-28 ENCOUNTER — Other Ambulatory Visit: Payer: Self-pay

## 2016-11-04 ENCOUNTER — Other Ambulatory Visit (INDEPENDENT_AMBULATORY_CARE_PROVIDER_SITE_OTHER): Payer: BLUE CROSS/BLUE SHIELD

## 2016-11-04 DIAGNOSIS — I10 Essential (primary) hypertension: Secondary | ICD-10-CM | POA: Diagnosis not present

## 2016-11-05 LAB — BMP8+ANION GAP
Anion Gap: 15 mmol/L (ref 10.0–18.0)
BUN / CREAT RATIO: 16 (ref 12–28)
BUN: 14 mg/dL (ref 8–27)
CHLORIDE: 97 mmol/L (ref 96–106)
CO2: 26 mmol/L (ref 18–29)
Calcium: 9.3 mg/dL (ref 8.7–10.3)
Creatinine, Ser: 0.88 mg/dL (ref 0.57–1.00)
GFR calc non Af Amer: 71 mL/min/{1.73_m2} (ref >59)
GFR, EST AFRICAN AMERICAN: 82 mL/min/{1.73_m2} (ref >59)
Glucose: 88 mg/dL (ref 65–99)
POTASSIUM: 4 mmol/L (ref 3.5–5.2)
Sodium: 138 mmol/L (ref 134–144)

## 2016-11-07 ENCOUNTER — Other Ambulatory Visit: Payer: Self-pay | Admitting: Internal Medicine

## 2016-11-07 DIAGNOSIS — Z1231 Encounter for screening mammogram for malignant neoplasm of breast: Secondary | ICD-10-CM

## 2016-11-15 ENCOUNTER — Telehealth: Payer: Self-pay | Admitting: Internal Medicine

## 2016-11-16 NOTE — Telephone Encounter (Signed)
Patient has been scheduled to see Dr. Dimple Caseyice on 12/09/16 at 3:45 pm.  Appointment card has been mailed.

## 2016-12-05 ENCOUNTER — Ambulatory Visit: Payer: Self-pay

## 2016-12-09 ENCOUNTER — Encounter: Payer: Self-pay | Admitting: Internal Medicine

## 2017-02-20 ENCOUNTER — Other Ambulatory Visit: Payer: Self-pay | Admitting: Internal Medicine

## 2017-02-21 ENCOUNTER — Other Ambulatory Visit: Payer: Self-pay | Admitting: *Deleted

## 2017-02-25 MED ORDER — AMLODIPINE BESYLATE 5 MG PO TABS
5.0000 mg | ORAL_TABLET | Freq: Every day | ORAL | 11 refills | Status: DC
Start: 2017-02-25 — End: 2018-02-27

## 2017-06-16 ENCOUNTER — Encounter: Payer: Self-pay | Admitting: Internal Medicine

## 2017-06-27 ENCOUNTER — Other Ambulatory Visit: Payer: Self-pay | Admitting: Internal Medicine

## 2017-06-27 DIAGNOSIS — Z1231 Encounter for screening mammogram for malignant neoplasm of breast: Secondary | ICD-10-CM

## 2017-07-17 ENCOUNTER — Ambulatory Visit: Payer: Self-pay

## 2017-08-04 ENCOUNTER — Encounter: Payer: Self-pay | Admitting: Internal Medicine

## 2017-08-09 ENCOUNTER — Other Ambulatory Visit: Payer: Self-pay | Admitting: *Deleted

## 2017-08-10 MED ORDER — IBUPROFEN 400 MG PO TABS: 400.0000 mg | ORAL_TABLET | Freq: Three times a day (TID) | ORAL | 0 refills | Status: DC | PRN

## 2017-09-01 ENCOUNTER — Ambulatory Visit (INDEPENDENT_AMBULATORY_CARE_PROVIDER_SITE_OTHER): Payer: BLUE CROSS/BLUE SHIELD | Admitting: Internal Medicine

## 2017-09-01 VITALS — BP 142/72 | HR 88 | Temp 98.2°F | Ht 64.0 in | Wt 173.7 lb

## 2017-09-01 DIAGNOSIS — Z79899 Other long term (current) drug therapy: Secondary | ICD-10-CM | POA: Diagnosis not present

## 2017-09-01 DIAGNOSIS — L739 Follicular disorder, unspecified: Secondary | ICD-10-CM

## 2017-09-01 DIAGNOSIS — F17200 Nicotine dependence, unspecified, uncomplicated: Secondary | ICD-10-CM

## 2017-09-01 DIAGNOSIS — E876 Hypokalemia: Secondary | ICD-10-CM | POA: Diagnosis not present

## 2017-09-01 DIAGNOSIS — E785 Hyperlipidemia, unspecified: Secondary | ICD-10-CM

## 2017-09-01 DIAGNOSIS — I10 Essential (primary) hypertension: Secondary | ICD-10-CM

## 2017-09-01 HISTORY — DX: Follicular disorder, unspecified: L73.9

## 2017-09-01 MED ORDER — TRIAMTERENE-HCTZ 37.5-25 MG PO CAPS: 1.0000 | ORAL_CAPSULE | Freq: Every day | ORAL | 11 refills | Status: DC

## 2017-09-01 MED ORDER — MUPIROCIN 2 % EX OINT: 1.0000 "application " | TOPICAL_OINTMENT | Freq: Two times a day (BID) | CUTANEOUS | 0 refills | Status: DC | PRN

## 2017-09-01 NOTE — Progress Notes (Signed)
   CC: painless bumps in groin  HPI:  [name] is a 62 y.o.  F with history of hypertension, HLD, and tobacco abuse who presents today for evaluation of painless bumps on her groin. For details regarding today's visit and the status of their chronic medical issues, please refer to the assessment and plan.  Past Medical History:  Diagnosis Date  . Allergy   . H/O hiatal hernia   . Hyperlipidemia   . Hypertension    Review of Systems:   General: Denies fevers, chills, weight loss, fatigue HEENT: Denies acute changes in vision, sore throat Cardiac: Denies CP, SOB Abd: Denies abdominal pain, dysuria, vaginal discharge or changes in bowels Extremities: Denies weakness or swelling  Physical Exam: General: Alert, in no acute distress. Pleasant and conversant HEENT: No icterus, injection or ptosis. No hoarseness or dysarthria  Cardiac: RRR, no MGR appreciated Pulmonary: CTA BL with normal WOB on RA. Able to speak in complete sentences Abd: Soft, non-tender. +bs GU: No obvious active lesions. Hyperpigmentation and tiny spot of firmness at site of recent bump. No pain.    Vitals:   09/01/17 1554 09/01/17 1610  BP: (!) 151/76 (!) 142/72  Pulse: 94 88  Temp: 98.2 F (36.8 C)   TempSrc: Oral   SpO2: 100%   Weight: 173 lb 11.2 oz (78.8 kg)   Height: 5\' 4"  (1.626 m)    Body mass index is 29.82 kg/m.  Assessment & Plan:   See Encounters Tab for problem based charting.  Patient discussed with Dr. Cleda DaubE. Hoffman

## 2017-09-01 NOTE — Assessment & Plan Note (Addendum)
Assessment: Patient was initially hypertensive but resolved on recheck. She notes compliance with Amlodipine 5mg  and triamterene-HCTZ 37.5-25mg  daily, although has been out for a few days and requests a refill. She also mentions she has not been taking her potassium supplement and wonders if she should restart this.   Plan: I'm pleased her BP improved on recheck. I still believe she would benefit from continuation of her Dyazide. I will send in Rx to pharmacy today and will also check renal function/lytes.  -Continue Amlodipine 5mg  -Continue Dyazide

## 2017-09-01 NOTE — Assessment & Plan Note (Addendum)
Assessment: [name] presents today for evaluation of a 3-week history of recurrent painless bumps in her inner thighs. 3 weeks ago she noted 3 small painless "pimples" in her groin. They resolved on their own without drainage, any pain, and denied any vesicular type qualities. Again had recurrence last week but resolved quickly with topical Neosporin. Denies new sexual partners and denied history of similar episodes in herself or current partner. Exam shows some slight hyperpigmentation and a TINY firm spot at site of lesion last week which was without TTP or drainage.   Plan: Believe her presentation is most consistent with mild folliculitis. We discussed ways to reduce bacterial load in the area and exfoliation to help prevent further episodes. She requests a topical cream for when they reoccur.  -Rx sent to pharmacy for Bactroban prn

## 2017-09-01 NOTE — Assessment & Plan Note (Signed)
Assessment: Patient mentions history of hypokalemia and taking potassium powder for replacement. She has been out of this and wonders if she should keep taking this.   Plan: Will check BMET today and send Rx based on results. May benefit from checking/replacing Mag at her follow-up appointment if she remains hypokalemic

## 2017-09-01 NOTE — Assessment & Plan Note (Addendum)
Patients recent cholesterol panel resulted with LDL >170 and I suspect she would be a candidate for at least a moderate intensity statin for primary prevention. Chart review shows a past ASCVD score of 28% but patient refused statin as she heard that people were going blind and dying. She has an appointment with her PCP in 4 weeks and encouraged her to consider starting lipid-lowering therapy.

## 2017-09-02 LAB — BMP8+ANION GAP
ANION GAP: 17 mmol/L (ref 10.0–18.0)
BUN/Creatinine Ratio: 18 (ref 12–28)
BUN: 13 mg/dL (ref 8–27)
CO2: 26 mmol/L (ref 20–29)
CREATININE: 0.73 mg/dL (ref 0.57–1.00)
Calcium: 9.4 mg/dL (ref 8.7–10.3)
Chloride: 100 mmol/L (ref 96–106)
GFR calc Af Amer: 102 mL/min/{1.73_m2} (ref >59)
GFR, EST NON AFRICAN AMERICAN: 89 mL/min/{1.73_m2} (ref >59)
Glucose: 119 mg/dL — ABNORMAL HIGH (ref 65–99)
Potassium: 3.1 mmol/L — ABNORMAL LOW (ref 3.5–5.2)
Sodium: 143 mmol/L (ref 134–144)

## 2017-09-04 NOTE — Progress Notes (Signed)
Internal Medicine Clinic Attending  Case discussed with Dr. Molt at the time of the visit.  We reviewed the resident's history and exam and pertinent patient test results.  I agree with the assessment, diagnosis, and plan of care documented in the resident's note. 

## 2017-09-29 ENCOUNTER — Ambulatory Visit: Payer: BLUE CROSS/BLUE SHIELD | Admitting: Internal Medicine

## 2017-09-29 VITALS — BP 148/76 | HR 100 | Temp 99.0°F | Wt 172.4 lb

## 2017-09-29 DIAGNOSIS — M67911 Unspecified disorder of synovium and tendon, right shoulder: Secondary | ICD-10-CM

## 2017-09-29 DIAGNOSIS — Z791 Long term (current) use of non-steroidal anti-inflammatories (NSAID): Secondary | ICD-10-CM | POA: Diagnosis not present

## 2017-09-29 NOTE — Progress Notes (Signed)
   CC: Right shoulder pain  HPI:  [name] is a 62 y.o. female with PMHx detailed below presenting with about 1 month of ongoing moderate to severe right shoulder pain.  There was no associated trauma or event with the start of pain.  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  It is also very bothersome at night particularly if she lies on the right side.  The pain is generally worse if she tries to raise her hands above shoulder height.  She has not noticed any significant swelling or redness.  She is taking over-the-counter ibuprofen about 4 times daily without significant relief.  There is no radiation of her pain.  See problem based assessment and plan below for additional details.  Tendinopathy of right rotator cuff Her history and exam are highly consistent with rotator cuff tendinopathy.  Supraspinatus is most likely given predominantly abduction symptoms.  I have no concern for a significant tear requiring procedural intervention due to no loss of strength or traumatic etiology.  She reports previous good response to intra-articular injection of the ankle after injury a few years ago and would like to pursue this today.  She has already maximized her benefit with over-the-counter NSAIDs.  Plan: Right shoulder steroid injection for relief of rotator cuff tendinopathy  Consent obtained and verified. Time-out conducted. Noted no overlying erythema, induration, or other signs of local infection. Patient reported iodine allergy noted. Skin prepped in a sterile fashion with alcohol wipes. Topical analgesic spray: Ethyl chloride. Joint: Right subacromial bursa Needle: 25g x 1" Completed without difficulty. Meds: 2mL 1% lidocaine,  Kenalog  Dr. Harmon Dun was present throughout the procedure in a supervising capacity.  Advised to call if fevers/chills, erythema, induration, drainage, or persistent bleeding.     Past Medical History:  Diagnosis  Date  . Allergy   . H/O hiatal hernia   . Hyperlipidemia   . Hypertension     Review of Systems: Review of Systems  Constitutional: Negative for fever.  Cardiovascular: Negative for chest pain.  Musculoskeletal: Positive for joint pain.  Skin: Negative for rash.  Neurological: Negative for weakness.     Physical Exam: Vitals:   09/29/17 1515  BP: (!) 148/76  Pulse: 100  Temp: 99 F (37.2 C)  TempSrc: Oral  SpO2: 99%  Weight: 172 lb 6.4 oz (78.2 kg)   GENERAL- alert, co-operative, NAD CARDIAC- RRR, no murmurs, rubs or gallops. RESP- CTAB, no wheezes or crackles. NEURO- sensation is intact throughout right arm, hand strength 5 out of 5 EXTREMITIES- right shoulder is minimally tender to direct palpation, positive painful arc and empty can test, active range of motion is preserved though painful in abduction SKIN- Warm, dry, No rash or lesion. PSYCH- Normal mood and affect, appropriate thought content and speech.   Assessment & Plan:   See encounters tab for problem based medical decision making.   Patient seen with Dr. Cleda Daub

## 2017-10-02 DIAGNOSIS — M67911 Unspecified disorder of synovium and tendon, right shoulder: Secondary | ICD-10-CM

## 2017-10-02 HISTORY — DX: Unspecified disorder of synovium and tendon, right shoulder: M67.911

## 2017-10-02 NOTE — Assessment & Plan Note (Addendum)
Her history and exam are highly consistent with rotator cuff tendinopathy.  Supraspinatus is most likely given predominantly abduction symptoms.  I have no concern for a significant tear requiring procedural intervention due to no loss of strength or traumatic etiology.  She reports previous good response to intra-articular injection of the ankle after injury a few years ago and would like to pursue this today.  She has already maximized her benefit with over-the-counter NSAIDs.  Plan: Right shoulder steroid injection for relief of rotator cuff tendinopathy  Consent obtained and verified. Time-out conducted. Noted no overlying erythema, induration, or other signs of local infection. Patient reported iodine allergy noted. Skin prepped in a sterile fashion with alcohol wipes. Topical analgesic spray: Ethyl chloride. Joint: Right subacromial bursa Needle: 25g x 1" Completed without difficulty. Meds: 2mL 1% lidocaine,  Kenalog  Dr. Harmon Dun was present throughout the procedure in a supervising capacity.  Advised to call if fevers/chills, erythema, induration, drainage, or persistent bleeding.

## 2017-10-03 NOTE — Progress Notes (Signed)
Internal Medicine Clinic Attending  I saw and evaluated the patient.  I personally confirmed the key portions of the history and exam documented by Dr. Rice and I reviewed pertinent patient test results.  The assessment, diagnosis, and plan were formulated together and I agree with the documentation in the resident's note. I was present for the entire procedure. 

## 2017-11-06 ENCOUNTER — Ambulatory Visit: Payer: Self-pay

## 2017-11-13 ENCOUNTER — Ambulatory Visit: Payer: Self-pay

## 2017-11-22 ENCOUNTER — Other Ambulatory Visit: Payer: Self-pay

## 2017-11-22 ENCOUNTER — Encounter: Payer: Self-pay | Admitting: Internal Medicine

## 2017-11-22 ENCOUNTER — Ambulatory Visit (INDEPENDENT_AMBULATORY_CARE_PROVIDER_SITE_OTHER): Payer: BLUE CROSS/BLUE SHIELD | Admitting: Internal Medicine

## 2017-11-22 DIAGNOSIS — M79671 Pain in right foot: Secondary | ICD-10-CM | POA: Insufficient documentation

## 2017-11-22 HISTORY — DX: Pain in right foot: M79.671

## 2017-11-22 NOTE — Assessment & Plan Note (Signed)
See HPI for full details  Plan: No evidence of infection on exam today.  Local inflammation secondary to possible insect bite without any systemic signs.  Will treat conservatively with ice/heat, NSAIDs or Tylenol.  Return precautions provided the patient.

## 2017-11-22 NOTE — Progress Notes (Signed)
   CC: Stung on foot  HPI:  [name] is a 62 y.o. female with a past medical history listed below here today with complaints of right foot pain after being stung on her foot.  She reports that she was out in her front yard yesterday when she had a sharp stabbing pain on the medial aspect of her right foot.  She reports that she did not see anything that may have bit her but believes an insect bit or stung her.  She reports that she tended to the area and used antibacterial ointment.  However, she had continued pain and swelling in her foot and came in for evaluation.  She denies any fevers, , itching, shortness of breath.  Hives she reports she has not taken anything for pain and is concerned that there may be an infection.   Past Medical History:  Diagnosis Date  . Allergy   . H/O hiatal hernia   . Hyperlipidemia   . Hypertension    Review of Systems:   Negative except as noted in HPI  Physical Exam:  Vitals:   11/22/17 1029  BP: (!) 151/78  Pulse: 88  Temp: 99.2 F (37.3 C)  TempSrc: Oral  SpO2: 100%  Weight: 170 lb 8 oz (77.3 kg)  Height: 5\' 4"  (1.626 m)   GENERAL- alert, co-operative, appears as stated age, not in any distress. HEENT- Atraumatic, normocephalic CARDIAC- RRR, no murmurs, rubs or gallops. RESP- Clear to auscultation bilaterally, no wheezes or crackles. MSK -there is edema surrounding the medial aspect of her first MTP joint and the surrounding area.  It is tender to palpation.  No fluctuance.  No erythema or warmth.  She has full range of motion both actively and passively.  Pulses intact.    Assessment & Plan:   See Encounters Tab for problem based charting.  Patient discussed with Dr. Cleda DaubE. Hoffman

## 2017-11-22 NOTE — Patient Instructions (Signed)
Caitlyn Ingram,  I am sorry about your foot.  What ever stung or bit you was likely a small insect and you are having a local reaction to the bite.  It does not appear infected today and you have some local inflammation and swelling from the bite.  If you start to notice redness or streaking from the spot, fevers, trouble breathing, pus fluid draining then please come back to see us.  Otherwise the inflammation and swelling should improve over the next several days with ice/heat, ibuprofen or Tylenol.  If you have any problems please come back to see us.

## 2017-11-27 NOTE — Progress Notes (Signed)
Internal Medicine Clinic Attending  Case discussed with Dr. Boswell at the time of the visit.  We reviewed the resident's history and exam and pertinent patient test results.  I agree with the assessment, diagnosis, and plan of care documented in the resident's note.  

## 2017-12-20 ENCOUNTER — Encounter: Payer: Self-pay | Admitting: *Deleted

## 2017-12-26 ENCOUNTER — Other Ambulatory Visit: Payer: Self-pay | Admitting: Internal Medicine

## 2018-01-30 ENCOUNTER — Ambulatory Visit
Admission: RE | Admit: 2018-01-30 | Discharge: 2018-01-30 | Disposition: A | Discharge status: Home / Self Care | Payer: BLUE CROSS/BLUE SHIELD | Source: Ambulatory Visit | Attending: *Deleted | Admitting: *Deleted

## 2018-01-30 DIAGNOSIS — Z1231 Encounter for screening mammogram for malignant neoplasm of breast: Secondary | ICD-10-CM

## 2018-02-27 ENCOUNTER — Other Ambulatory Visit: Payer: Self-pay | Admitting: Internal Medicine

## 2018-02-27 NOTE — Telephone Encounter (Signed)
Last appt 11/22/2017  No future apps scheduled Message sent to front office for an appt Refill request sent to pcp for consideration Please advise.Kingsley Spittle Cassady10/1/20191:37 PM

## 2018-04-01 ENCOUNTER — Other Ambulatory Visit: Payer: Self-pay | Admitting: Internal Medicine

## 2018-04-02 ENCOUNTER — Encounter (INDEPENDENT_AMBULATORY_CARE_PROVIDER_SITE_OTHER): Payer: Self-pay

## 2018-04-02 ENCOUNTER — Ambulatory Visit (INDEPENDENT_AMBULATORY_CARE_PROVIDER_SITE_OTHER): Payer: BLUE CROSS/BLUE SHIELD | Admitting: Internal Medicine

## 2018-04-02 ENCOUNTER — Encounter: Payer: Self-pay | Admitting: Internal Medicine

## 2018-04-02 DIAGNOSIS — M25462 Effusion, left knee: Secondary | ICD-10-CM

## 2018-04-02 HISTORY — DX: Effusion, left knee: M25.462

## 2018-04-02 NOTE — Patient Instructions (Addendum)
Thank you for coming to the clinic today. It was a pleasure to see you.   For your left knee injury, please follow the instructions below which explain how to use rest, ice, and compression to allow your knee to heal on its own. If your knee pain worsens before your upcoming appointment with your primary care doctor please follow up with [place] in the acute care clinic.   FOLLOW-UP INSTRUCTIONS When: November 22 with Dr. Dortha Schwalbe  For: general follow up  What to bring: all of your medication bottles   Please call the internal medicine center clinic if you have any questions or concerns, we may be able to help and keep you from a long and expensive emergency room wait. Our clinic and after hours phone number is [phone], the best time to call is Monday through Friday 9 am to 4 pm but there is always someone available 24/7 if you have an emergency. If you need medication refills please notify your pharmacy one week in advance and they will send [place] a request.    RICE for Routine Care of Injuries Many injuries can be cared for using rest, ice, compression, and elevation (RICE therapy). Using RICE therapy can help to lessen pain and swelling. It can help your body to heal. Rest Reduce your normal activities and avoid using the injured part of your body. You can go back to your normal activities when you feel okay and your doctor says it is okay. Ice Do not put ice on your bare skin.  Put ice in a plastic bag.  Place a towel between your skin and the bag.  Leave the ice on for 20 minutes, 2-3 times a day.  Do this for as long as told by your doctor. Compression Compression means putting pressure on the injured area. This can be done with an elastic bandage. If an elastic bandage has been applied:  Remove and reapply the bandage every 3-4 hours or as told by your doctor.  Make sure the bandage is not wrapped too tight. Wrap the bandage more loosely if part of your body beyond the bandage is  blue, swollen, cold, painful, or loses feeling (numb).  See your doctor if the bandage seems to make your problems worse.  Elevation Elevation means keeping the injured area raised. Raise the injured area above your heart or the center of your chest if you can. When should I get help? You should get help if:  You keep having pain and swelling.  Your symptoms get worse.  Get help right away if: You should get help right away if:  You have sudden bad pain at or below the area of your injury.  You have redness or more swelling around your injury.  You have tingling or numbness at or below the injury that does not go away when you take off the bandage.  This information is not intended to replace advice given to you by your health care provider. Make sure you discuss any questions you have with your health care provider. Document Released: 11/02/2007 Document Revised: 04/12/2016 Document Reviewed: 04/23/2014 Elsevier Interactive Patient Education  2017 [employer].

## 2018-04-02 NOTE — Assessment & Plan Note (Signed)
Patient presents with two days of swelling in the left knee. She went to a party the day before the symptoms starting thinks that she may have danced too much because she noticed pain and swelling in the knee the following day. Ibuprofen helps to relieve the pain completely. She has no popping or locking of the knee.  Exam today is consistent with underlying osteoarthritis, the knee is not warm or erythematous on exam despite the obvious effusion. Given that the effusion is most likely traumatic in nature, we have discussed conservative management with RICE. We discussed return precautions for arthrocentesis such as worsening of the pain and swelling limiting her use of the knee, fever, chills or warmth developing over the knee. The effusion is so small that it would be difficult to aspirate at this time but if it were to continue to accumulate arthrocentesis would be necessary.

## 2018-04-02 NOTE — Progress Notes (Signed)
   CC: left knee leg pain   HPI:  Caitlyn Ingram is a 62 y.o. with PMH as listed below who presents for evaluation of left knee pain. Please see the assessment and plans for the status of the patient chronic medical problems.   Past Medical History:  Diagnosis Date  . Allergy   . H/O hiatal hernia   . Hyperlipidemia   . Hypertension    Review of Systems:  Refer to history of present illness and assessment and plans for pertinent review of systems, all others reviewed and negative  Physical Exam:  Vitals:   04/02/18 1335 04/02/18 1432  BP: (!) 147/70 (!) 144/80  Pulse: (!) 103 98  Temp: 99.5 F (37.5 C)   TempSrc: Oral   SpO2: 100%   Weight: 172 lb 14.4 oz (78.4 kg)   Height: 5\' 4"  (1.626 m)    General: well appearing, no acute distress MSK: bilateral crepitus in both knees, small effusion evident in the lateral left knee, tenderness to palpation over the medial and lateral joint line of the left knee, no edema in the legs   Assessment & Plan:   Left knee effusion  Patient presents with two days of swelling in the left knee. She went to a party the day before the symptoms starting thinks that she may have danced too much because she noticed pain and swelling in the knee the following day. Ibuprofen helps to relieve the pain completely. She has no popping or locking of the knee.  Exam today is consistent with underlying osteoarthritis, the knee is not warm or erythematous on exam despite the obvious effusion. Given that the effusion is most likely traumatic in nature, we have discussed conservative management with RICE. We discussed return precautions for arthrocentesis such as worsening of the pain and swelling limiting her use of the knee, fever, chills or warmth developing over the knee. The effusion is so small that it would be difficult to aspirate at this time but if it were to continue to accumulate arthrocentesis would be necessary.    See Encounters Tab for problem  based charting.  Patient discussed with Dr. Sandre Kitty

## 2018-04-04 ENCOUNTER — Other Ambulatory Visit: Payer: Self-pay | Admitting: *Deleted

## 2018-04-04 NOTE — Telephone Encounter (Signed)
Last appt 11/4 in [facility] Next appt 11/22 w/pcp  Refill request sent to pcp for consideration Please advise.Criss Alvine, Rojelio Uhrich Cassady11/6/20199:07 AM

## 2018-04-05 ENCOUNTER — Other Ambulatory Visit: Payer: Self-pay | Admitting: Internal Medicine

## 2018-04-06 NOTE — Telephone Encounter (Signed)
ibuprofen (ADVIL,MOTRIN) 400 MG tablet   , REFILL REQUEST @  [address] [place], [place] - [address] [phone] (Phone) [phone] (Fax)

## 2018-04-06 NOTE — Progress Notes (Signed)
Internal Medicine Clinic Attending  Case discussed with Dr. Blum at the time of the visit.  We reviewed the resident's history and exam and pertinent patient test results.  I agree with the assessment, diagnosis, and plan of care documented in the resident's note.  Alexander Raines, M.D., Ph.D.  

## 2018-04-09 NOTE — Telephone Encounter (Signed)
Pt called and is upset, states she saw dr blum and they discussed her using IBU for pain and swelling of L knee. Have texted dr Dortha Schwalbe

## 2018-04-20 ENCOUNTER — Encounter: Payer: Self-pay | Admitting: Internal Medicine

## 2018-05-03 ENCOUNTER — Ambulatory Visit: Payer: Self-pay

## 2018-05-10 ENCOUNTER — Telehealth: Payer: Self-pay | Admitting: Internal Medicine

## 2018-06-12 ENCOUNTER — Encounter: Payer: Self-pay | Admitting: Internal Medicine

## 2018-08-20 ENCOUNTER — Other Ambulatory Visit: Payer: Self-pay | Admitting: *Deleted

## 2018-08-20 ENCOUNTER — Encounter: Payer: Self-pay | Admitting: Internal Medicine

## 2018-08-20 DIAGNOSIS — I10 Essential (primary) hypertension: Secondary | ICD-10-CM

## 2018-08-20 MED ORDER — TRIAMTERENE-HCTZ 37.5-25 MG PO CAPS
1.0000 | ORAL_CAPSULE | Freq: Every day | ORAL | 11 refills | Status: DC
Start: 2018-08-20 — End: 2018-12-05

## 2018-08-24 ENCOUNTER — Other Ambulatory Visit: Payer: Self-pay | Admitting: Internal Medicine

## 2018-09-28 ENCOUNTER — Encounter: Payer: Self-pay | Admitting: Internal Medicine

## 2018-10-13 IMAGING — DX DG CHEST 2V
2 series · 2 of 2 positions shown · non-contrast
Comparison: 04/23/2007

CLINICAL DATA: Cough and wheezing for 1 week

EXAM:
CHEST  2 VIEW

[chest pa]
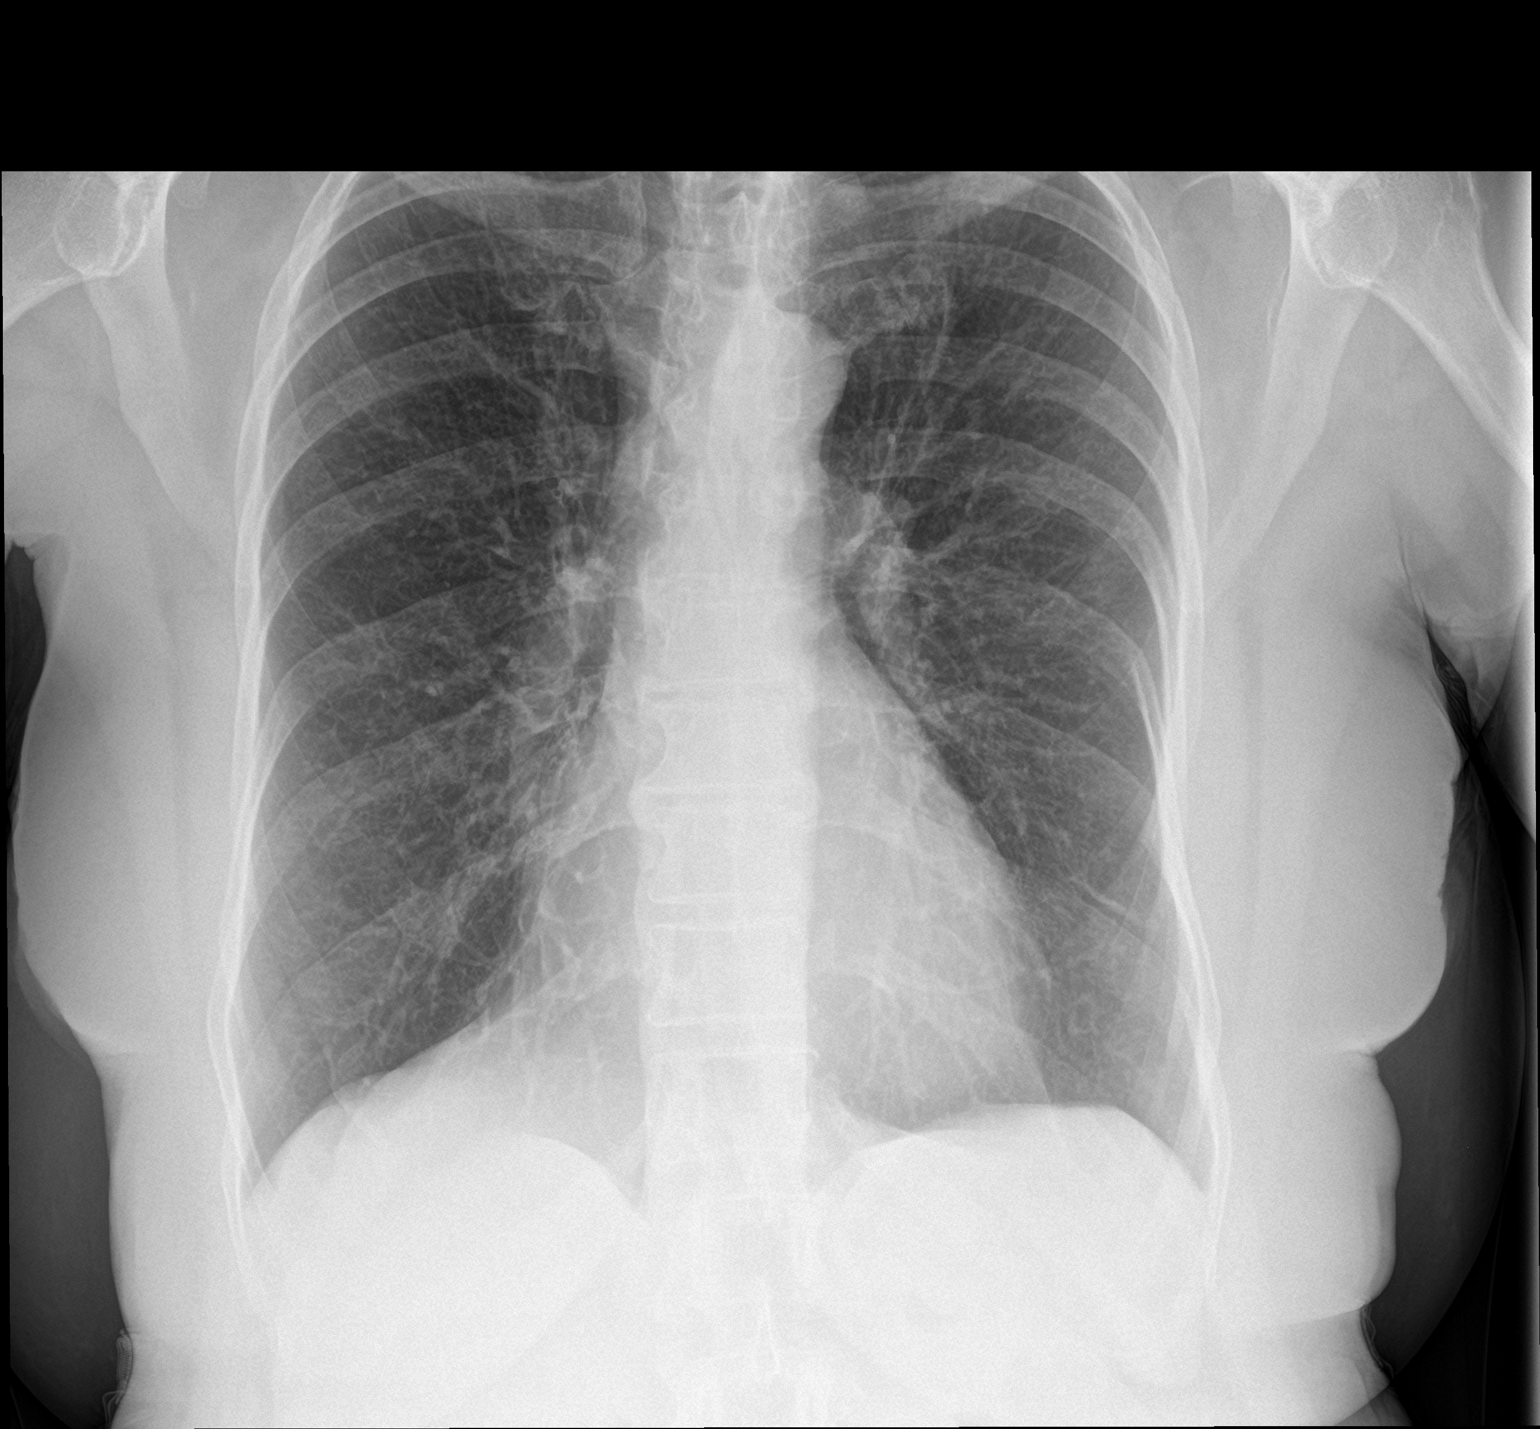

[chest lat]
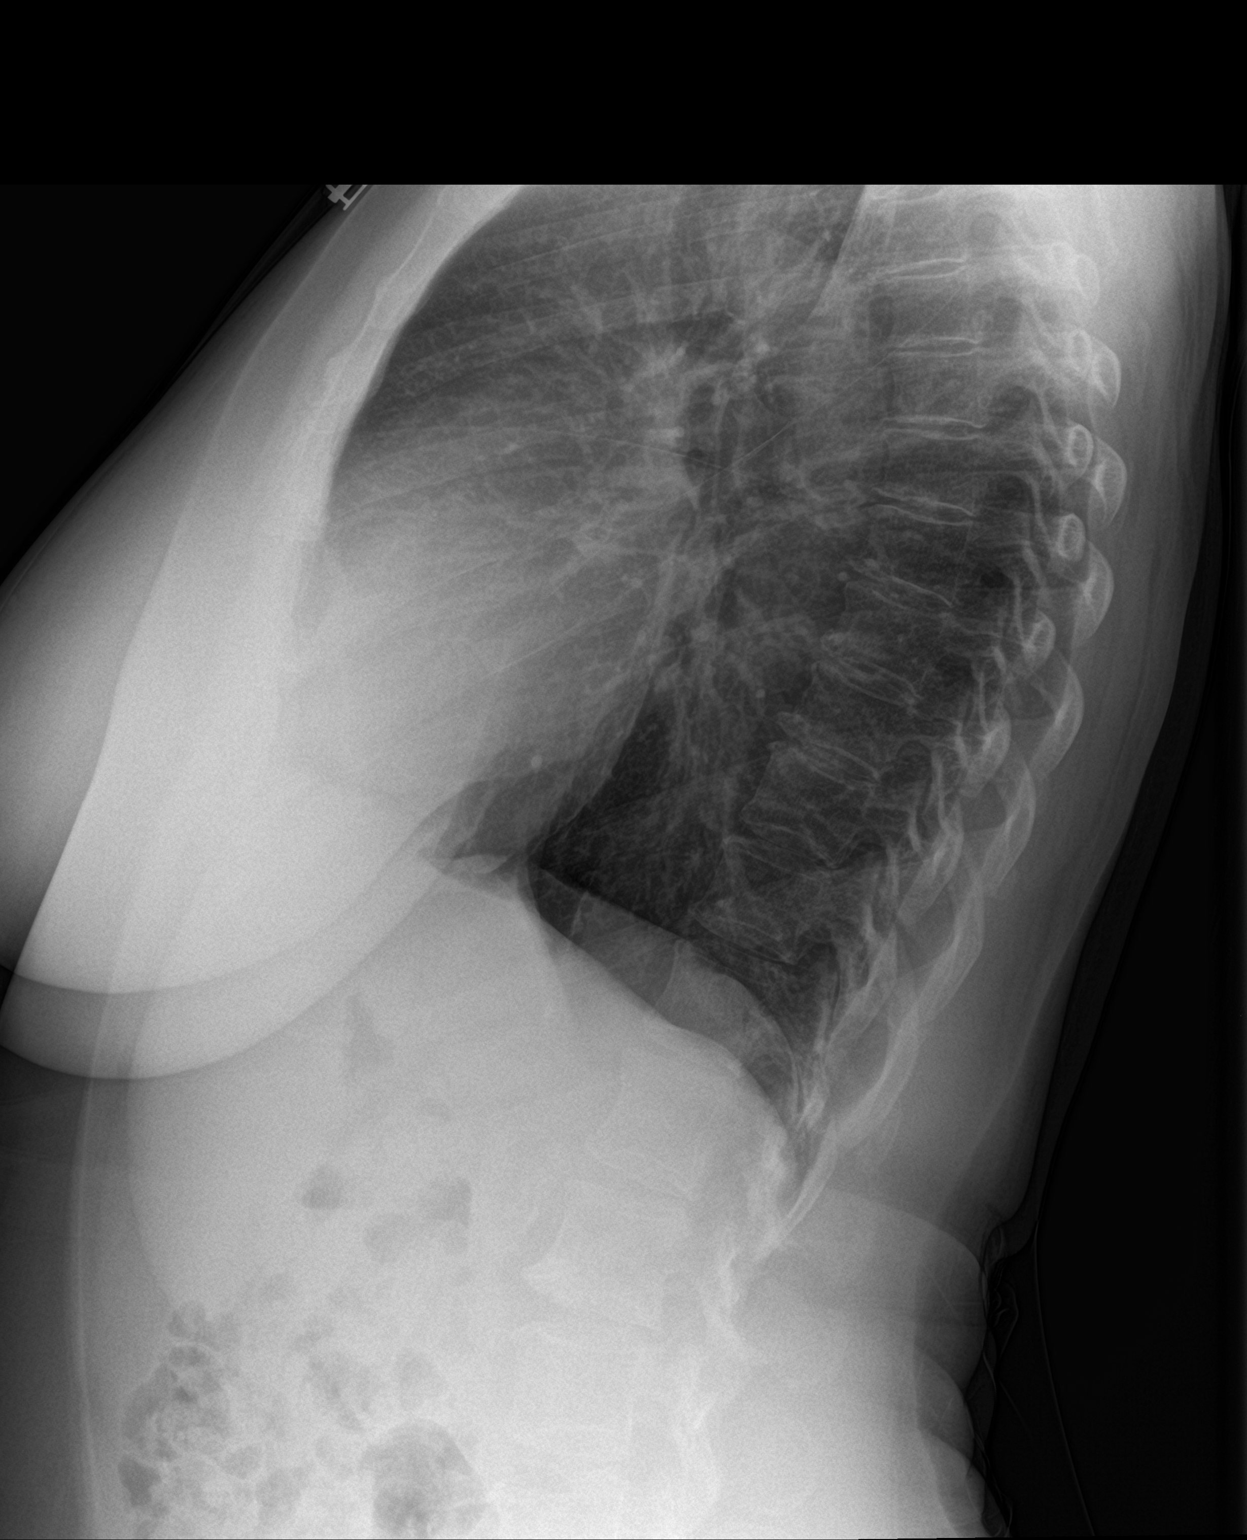

[2 of 2 positions shown; findings below may reference images not displayed]

FINDINGS: Cardiac shadow is within normal limits. The lungs are hyperinflated.
No focal infiltrate or sizable effusion is seen. No acute bony
abnormality is noted.
IMPRESSION: COPD without acute abnormality.

## 2018-10-19 ENCOUNTER — Encounter: Payer: Self-pay | Admitting: Internal Medicine

## 2018-10-24 ENCOUNTER — Encounter: Payer: Self-pay | Admitting: Internal Medicine

## 2018-11-15 ENCOUNTER — Encounter: Payer: Self-pay | Admitting: Internal Medicine

## 2018-12-05 ENCOUNTER — Ambulatory Visit: Payer: BLUE CROSS/BLUE SHIELD | Admitting: Internal Medicine

## 2018-12-05 ENCOUNTER — Encounter: Payer: Self-pay | Admitting: Internal Medicine

## 2018-12-05 ENCOUNTER — Other Ambulatory Visit: Payer: Self-pay

## 2018-12-05 DIAGNOSIS — E785 Hyperlipidemia, unspecified: Secondary | ICD-10-CM | POA: Diagnosis not present

## 2018-12-05 DIAGNOSIS — R059 Cough, unspecified: Secondary | ICD-10-CM

## 2018-12-05 DIAGNOSIS — Z79899 Other long term (current) drug therapy: Secondary | ICD-10-CM | POA: Diagnosis not present

## 2018-12-05 DIAGNOSIS — E876 Hypokalemia: Secondary | ICD-10-CM | POA: Diagnosis not present

## 2018-12-05 DIAGNOSIS — I10 Essential (primary) hypertension: Secondary | ICD-10-CM

## 2018-12-05 DIAGNOSIS — R05 Cough: Secondary | ICD-10-CM

## 2018-12-05 MED ORDER — IBUPROFEN 400 MG PO TABS: 200.0000 mg | ORAL_TABLET | Freq: Three times a day (TID) | ORAL | 0 refills | Status: DC | PRN

## 2018-12-05 MED ORDER — AMLODIPINE BESYLATE 5 MG PO TABS: 5.0000 mg | ORAL_TABLET | Freq: Every day | ORAL | 11 refills | Status: DC

## 2018-12-05 MED ORDER — TRIAMTERENE-HCTZ 37.5-25 MG PO CAPS: 1.0000 | ORAL_CAPSULE | Freq: Every day | ORAL | 11 refills | Status: DC

## 2018-12-05 MED ORDER — MUPIROCIN 2 % EX OINT: 1.0000 "application " | TOPICAL_OINTMENT | Freq: Two times a day (BID) | CUTANEOUS | 0 refills | Status: DC | PRN

## 2018-12-05 NOTE — Assessment & Plan Note (Signed)
Hypokalemia: Found to have K+ 3.1 in April 2019 and received potassium supplementation.  This is likely secondary to thiazide diuretic use.  Plan: -BMP today

## 2018-12-05 NOTE — Progress Notes (Signed)
   CC: Follow up hypertension, medication refill   HPI:  [name] is a 63 y.o. with medical history listed below presenting to follow-up on hypertension.  Please see problem based charting for further details.  Past Medical History:  Diagnosis Date  . Allergy   . H/O hiatal hernia   . Hyperlipidemia   . Hypertension    Review of Systems:  As per HPI  Physical Exam:  Vitals:   12/05/18 1559  BP: (!) 151/75  Pulse: 100  Temp: 98.5 F (36.9 C)  TempSrc: Oral  SpO2: 100%  Weight: 171 lb 12.8 oz (77.9 kg)  Height: 5\' 4"  (1.626 m)   Physical Exam Constitutional:      General: She is not in acute distress. Neck:     Musculoskeletal: Neck supple.  Cardiovascular:     Rate and Rhythm: Normal rate and regular rhythm.     Heart sounds: No murmur. No gallop.   Pulmonary:     Effort: Pulmonary effort is normal.     Breath sounds: Normal breath sounds. No wheezing or rales.  Abdominal:     General: Bowel sounds are normal.     Tenderness: There is no abdominal tenderness.  Musculoskeletal:     Right lower leg: No edema.     Left lower leg: No edema.  Neurological:     Mental Status: She is alert.     Assessment & Plan:   See Encounters Tab for problem based charting.  Patient discussed with Dr. Evette Doffing

## 2018-12-05 NOTE — Assessment & Plan Note (Signed)
Hyperlipidemia: We revisited discussion of possible starting statin therapy however she continues to refuse starting medication because she is scared as she heard on television that Lipitor, Crestor can cause death.  She tells me today that she is going to start exercising and also go to the [facility] store to buy fish oil.  We discussed tobacco cessation and she states she would like for this to be revisited at the next appointment.

## 2018-12-05 NOTE — Patient Instructions (Addendum)
Caitlyn Ingram,   It was a pleasure taking care of you here at the clinic today and also seen you in person.  Your blood pressure was elevated today and has been about the same for the last 3 readings.  Here my recommendations after our visit today.  1.  Please continue taking your blood pressure medication. 2.  I will advise that you cut back and stop smoking cigarettes  As we discussed about the high cholesterol, I highly suggest that we continue discussion starting the cholesterol medication.  Also, cutting back on smoking can also help reduce your risk for heart attack or stroke.  Take Care! Jeremy Johann

## 2018-12-05 NOTE — Assessment & Plan Note (Signed)
Hypertension: Above goal.  She was evaluated in the clinic in November 2019 and BP was found to be elevated.  She tells me today that she has run out of her medications is requesting a refill.  Per chart review, BP has ranged 140s-150s in the past year.  BP Readings from Last 3 Encounters:  12/05/18 (!) 151/75  04/02/18 (!) 144/80  11/22/17 (!) 151/78   Plan: -Continue triamterene-HCTZ 37.5-25 mg daily -Continue Amlodipine 5 mg daily

## 2018-12-06 ENCOUNTER — Other Ambulatory Visit: Payer: Self-pay | Admitting: Internal Medicine

## 2018-12-06 ENCOUNTER — Telehealth: Payer: Self-pay | Admitting: Internal Medicine

## 2018-12-06 DIAGNOSIS — I10 Essential (primary) hypertension: Secondary | ICD-10-CM

## 2018-12-06 LAB — BMP8+ANION GAP
Anion Gap: 17 mmol/L (ref 10.0–18.0)
BUN/Creatinine Ratio: 22 (ref 12–28)
BUN: 13 mg/dL (ref 8–27)
CO2: 23 mmol/L (ref 20–29)
Calcium: 9.6 mg/dL (ref 8.7–10.3)
Chloride: 99 mmol/L (ref 96–106)
Creatinine, Ser: 0.6 mg/dL (ref 0.57–1.00)
GFR calc Af Amer: 112 mL/min/{1.73_m2} (ref >59)
GFR calc non Af Amer: 97 mL/min/{1.73_m2} (ref >59)
Glucose: 107 mg/dL — ABNORMAL HIGH (ref 65–99)
Potassium: 3.1 mmol/L — ABNORMAL LOW (ref 3.5–5.2)
Sodium: 139 mmol/L (ref 134–144)

## 2018-12-06 MED ORDER — POTASSIUM CHLORIDE 20 MEQ PO PACK: 20.0000 meq | PACK | Freq: Every day | ORAL | 0 refills | Status: DC

## 2018-12-06 NOTE — Progress Notes (Signed)
Internal Medicine Clinic Attending  Case discussed with Dr. Agyei at the time of the visit.  We reviewed the resident's history and exam and pertinent patient test results.  I agree with the assessment, diagnosis, and plan of care documented in the resident's note.    

## 2018-12-06 NOTE — Telephone Encounter (Signed)
Called Caitlyn Ingram to inform her that I prescribed potassium supplements. Unable to reach her but I did leave a message.

## 2019-01-01 ENCOUNTER — Other Ambulatory Visit: Payer: Self-pay | Admitting: Internal Medicine

## 2019-01-01 ENCOUNTER — Other Ambulatory Visit: Payer: Self-pay | Admitting: Medical Oncology

## 2019-01-01 DIAGNOSIS — Z1231 Encounter for screening mammogram for malignant neoplasm of breast: Secondary | ICD-10-CM

## 2019-02-13 ENCOUNTER — Ambulatory Visit: Payer: BLUE CROSS/BLUE SHIELD

## 2019-06-05 ENCOUNTER — Encounter: Payer: BLUE CROSS/BLUE SHIELD | Admitting: Internal Medicine

## 2019-06-19 ENCOUNTER — Encounter: Payer: Self-pay | Admitting: Internal Medicine

## 2019-06-19 ENCOUNTER — Other Ambulatory Visit: Payer: Self-pay

## 2019-06-19 ENCOUNTER — Ambulatory Visit (INDEPENDENT_AMBULATORY_CARE_PROVIDER_SITE_OTHER): Payer: BLUE CROSS/BLUE SHIELD | Admitting: Internal Medicine

## 2019-06-19 VITALS — BP 153/78 | HR 105 | Temp 98.4°F | Wt 168.7 lb

## 2019-06-19 DIAGNOSIS — Z79899 Other long term (current) drug therapy: Secondary | ICD-10-CM

## 2019-06-19 DIAGNOSIS — I10 Essential (primary) hypertension: Secondary | ICD-10-CM | POA: Diagnosis not present

## 2019-06-19 DIAGNOSIS — E876 Hypokalemia: Secondary | ICD-10-CM

## 2019-06-19 MED ORDER — LISINOPRIL 10 MG PO TABS: 10.0000 mg | ORAL_TABLET | Freq: Every day | ORAL | 3 refills | Status: DC

## 2019-06-19 MED ORDER — MUPIROCIN 2 % EX OINT: 1.0000 "application " | TOPICAL_OINTMENT | Freq: Two times a day (BID) | CUTANEOUS | 0 refills | Status: DC | PRN

## 2019-06-19 MED ORDER — IBUPROFEN 400 MG PO TABS: 200.0000 mg | ORAL_TABLET | Freq: Three times a day (TID) | ORAL | 0 refills | Status: DC | PRN

## 2019-06-19 NOTE — Patient Instructions (Addendum)
Caitlyn Ingram,   Here are my recommendations today:   1.  Please keep measurement of your blood pressures at home twice a day.  In the morning and in the evening 2.  I want you to STOP TAKING this blood pressure medication (DYAZIDE) 3. START taking lisinopril 10mg  daily in addition to Amlodipine 5mg  daily.  Take care.

## 2019-06-19 NOTE — Assessment & Plan Note (Signed)
Hypertension: Not at goal.  She tells me today that at home, her BP readings are in the 120s/60s.  Each time she presents to the clinic, she gets nervous and thus continuously have elevated BPs and tachycardia. Bps in the past has ranged 130s-160s/60s-80s.  She is on a combination triamterene and hydrochlorothiazide however this has been complicated with hypokalemia.  She remains completely asymptomatic and denies chest pain shortness of breath, dizziness, lightheadedness.  Plan: -STOP Dyazide -Start lisinopril 10 mg daily  -Continue amlodipine 5 mg daily -Advised to keep BP log and record blood pressures twice a day and presents the next clinic visit -Follow-up BMP -Return to clinic in 2 to 4 weeks for hypertension follow-up

## 2019-06-19 NOTE — Progress Notes (Signed)
   CC: Follow-up hypertension  HPI:  Ms.Tujuana E Sebree is a 64 y.o. very pleasant African-American woman with medical history listed below presenting to follow-up on chronic medical problems.  Please see problem based charting for further details.   Past Medical History:  Diagnosis Date  . Allergic urticaria 04/10/2015  . Allergy   . Cough 10/14/2016  . Folliculitis 09/01/2017  . Foot pain, right 11/22/2017  . H/O hiatal hernia   . Hyperlipidemia   . Hypertension   . Knee effusion, left 04/02/2018   Review of Systems:  As per HPI  Physical Exam:  Vitals:   06/19/19 1556  BP: (!) 153/78  Pulse: (!) 105  Temp: 98.4 F (36.9 C)  TempSrc: Oral  SpO2: 100%  Weight: 168 lb 11.2 oz (76.5 kg)   Physical Exam  Constitutional: She is well-developed, well-nourished, and in no distress.  Cardiovascular: Regular rhythm. Tachycardia present.  Pulmonary/Chest: Effort normal and breath sounds normal. No respiratory distress. She has no wheezes.  Psychiatric: Mood, memory, affect and judgment normal.    Assessment & Plan:   See Encounters Tab for problem based charting.  Patient discussed with Dr. Heide Spark

## 2019-06-19 NOTE — Assessment & Plan Note (Addendum)
She is status post completion of potassium chloride.  Plan: -Follow BMP

## 2019-06-20 LAB — BMP8+ANION GAP
Anion Gap: 14 mmol/L (ref 10.0–18.0)
BUN/Creatinine Ratio: 18 (ref 12–28)
BUN: 12 mg/dL (ref 8–27)
CO2: 26 mmol/L (ref 20–29)
Calcium: 9.8 mg/dL (ref 8.7–10.3)
Chloride: 100 mmol/L (ref 96–106)
Creatinine, Ser: 0.65 mg/dL (ref 0.57–1.00)
GFR calc Af Amer: 109 mL/min/{1.73_m2} (ref >59)
GFR calc non Af Amer: 94 mL/min/{1.73_m2} (ref >59)
Glucose: 93 mg/dL (ref 65–99)
Potassium: 3.4 mmol/L — ABNORMAL LOW (ref 3.5–5.2)
Sodium: 140 mmol/L (ref 134–144)

## 2019-06-20 NOTE — Progress Notes (Signed)
Internal Medicine Clinic Attending  Case discussed with Dr. Agyei at the time of the visit.  We reviewed the resident's history and exam and pertinent patient test results.  I agree with the assessment, diagnosis, and plan of care documented in the resident's note.    

## 2019-06-20 NOTE — Addendum Note (Signed)
Addended by: Yvette Rack on: 06/20/2019 08:33 PM   Modules accepted: Level of Service

## 2019-06-21 ENCOUNTER — Telehealth: Payer: Self-pay | Admitting: Internal Medicine

## 2019-06-21 NOTE — Telephone Encounter (Signed)
Called patient regarding lab work.

## 2019-08-12 ENCOUNTER — Other Ambulatory Visit: Payer: Self-pay

## 2019-08-12 ENCOUNTER — Encounter: Payer: Self-pay | Admitting: Internal Medicine

## 2019-08-12 ENCOUNTER — Ambulatory Visit (INDEPENDENT_AMBULATORY_CARE_PROVIDER_SITE_OTHER): Payer: BLUE CROSS/BLUE SHIELD | Admitting: Internal Medicine

## 2019-08-12 DIAGNOSIS — Z79899 Other long term (current) drug therapy: Secondary | ICD-10-CM

## 2019-08-12 DIAGNOSIS — I10 Essential (primary) hypertension: Secondary | ICD-10-CM

## 2019-08-12 MED ORDER — AMLODIPINE BESYLATE 5 MG PO TABS: 10.0000 mg | ORAL_TABLET | Freq: Every day | ORAL | 11 refills | Status: DC

## 2019-08-12 NOTE — Patient Instructions (Addendum)
Ms. Cederberg,  Thanks for coming to clinic to see Caitlyn Ingram today.  As we discussed, you can hold off taking the lisinopril and I will increase the amlodipine to 10 mg.    I believe the exercise will help you with the blood pressure.  Take care! Dr. Dortha Schwalbe  Please call the internal medicine center clinic if you have any questions or concerns, we may be able to help and keep you from a long and expensive emergency room wait. Our clinic and after hours phone number is [phone], the best time to call is Monday through Friday 9 am to 4 pm but there is always someone available 24/7 if you have an emergency. If you need medication refills please notify your pharmacy one week in advance and they will send Caitlyn Ingram a request.

## 2019-08-12 NOTE — Progress Notes (Signed)
Internal Medicine Clinic Attending  Case discussed with Dr. Agyei at the time of the visit.  We reviewed the resident's history and exam and pertinent patient test results.  I agree with the assessment, diagnosis, and plan of care documented in the resident's note.    

## 2019-08-12 NOTE — Progress Notes (Signed)
   CC: Follow-up hypertension  HPI:  [name] is a 64 y.o. African-American woman with medical history listed below presenting to follow-up on chronic medical problems.  Please see problem based charting for further details.  Past Medical History:  Diagnosis Date  . Abnormal urine finding 09/20/2013  . Allergic urticaria 04/10/2015  . Allergy   . Cough 10/14/2016  . Folliculitis 09/01/2017  . Foot pain, right 11/22/2017  . H/O hiatal hernia   . Health care maintenance 07/01/2013  . Hyperlipidemia   . Hypertension   . Hypokalemia 10/29/2012  . Knee effusion, left 04/02/2018  . Musculoskeletal pain 09/20/2013   Review of Systems: As per HPI  Physical Exam:  Vitals:   08/12/19 1413 08/12/19 1435  BP: (!) 163/69 (!) 149/55  Pulse: (!) 108 93  Temp: 98.1 F (36.7 C)   TempSrc: Oral   SpO2: 100%   Weight: 171 lb 11.2 oz (77.9 kg)   Height: 5\' 5"  (1.651 m)    Physical Exam Vitals reviewed.  Constitutional:      Appearance: Normal appearance.  Cardiovascular:     Rate and Rhythm: Tachycardia present.     Heart sounds: Normal heart sounds. No murmur.  Pulmonary:     Effort: No respiratory distress.     Breath sounds: No wheezing or rales.  Neurological:     Mental Status: She is alert.  Psychiatric:        Mood and Affect: Mood normal.     Assessment & Plan:   See Encounters Tab for problem based charting.  Patient discussed with Dr. 

## 2019-08-12 NOTE — Assessment & Plan Note (Signed)
Hypertension: Not at goal.  She was previously on hydrochlorothiazide but she experienced hypokalemia and medication was discontinued.  She was started on lisinopril however she began experiencing cough and has since discontinued medication.  She only remains on amlodipine 5 mg daily.  Since her blood pressure is not controlled, we had a discussion of adding an ARB to her current regimen however she states that she wants to try exercising, watching her diet and losing weight to see how her blood pressure does.  BP Readings from Last 3 Encounters:  08/12/19 (!) 149/55  06/19/19 (!) 153/78  12/05/18 (!) 151/75   Plan: -Increase amlodipine to 10 mg daily -We will have ongoing discussion regarding exercise, weight loss, diet and potentially adding a second agent

## 2019-09-24 ENCOUNTER — Encounter: Payer: Self-pay | Admitting: *Deleted

## 2019-11-26 ENCOUNTER — Encounter: Payer: Self-pay | Admitting: Internal Medicine

## 2019-11-26 ENCOUNTER — Other Ambulatory Visit (HOSPITAL_COMMUNITY)
Admission: RE | Admit: 2019-11-26 | Discharge: 2019-11-26 | Disposition: A | Discharge status: Home / Self Care | Payer: BLUE CROSS/BLUE SHIELD | Source: Ambulatory Visit | Attending: Student in an Organized Health Care Education/Training Program | Admitting: Student in an Organized Health Care Education/Training Program

## 2019-11-26 ENCOUNTER — Ambulatory Visit (INDEPENDENT_AMBULATORY_CARE_PROVIDER_SITE_OTHER): Payer: BLUE CROSS/BLUE SHIELD | Admitting: Internal Medicine

## 2019-11-26 VITALS — BP 150/78 | HR 93 | Temp 98.2°F | Ht 65.0 in | Wt 171.6 lb

## 2019-11-26 DIAGNOSIS — R109 Unspecified abdominal pain: Secondary | ICD-10-CM | POA: Insufficient documentation

## 2019-11-26 DIAGNOSIS — Z0000 Encounter for general adult medical examination without abnormal findings: Secondary | ICD-10-CM | POA: Diagnosis not present

## 2019-11-26 DIAGNOSIS — I10 Essential (primary) hypertension: Secondary | ICD-10-CM

## 2019-11-26 MED ORDER — IBUPROFEN 400 MG PO TABS: 200.0000 mg | ORAL_TABLET | Freq: Three times a day (TID) | ORAL | 0 refills | Status: DC | PRN

## 2019-11-26 MED ORDER — HYDROCHLOROTHIAZIDE 25 MG PO TABS: 25.0000 mg | ORAL_TABLET | Freq: Every day | ORAL | 1 refills | Status: DC

## 2019-11-26 NOTE — Assessment & Plan Note (Signed)
Right-sided abdominal pain: She states that for about a week now she has been experiencing intermittent right-sided abdominal pain that is exacerbated when she feels bloated. 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 Currently she denies abdominal pain. She has not experienced fevers, nausea, vomiting, chills, radiation of the pain and is not postprandial in nature.   Assessment/plan: Given the location of the pain and patient's concern, will obtain CMP and abdominal ultrasound to assess for possible etiologies. However suspicion for cholecystitis, choledocholithiasis, hepatitis, pancreatitis seems less likely

## 2019-11-26 NOTE — Assessment & Plan Note (Signed)
Health maintenance: -Hep C screening -Pap smear

## 2019-11-26 NOTE — Progress Notes (Signed)
   CC: Follow-up hypertension  HPI:  [name] is a 64 y.o. very pleasant African-American woman with medical history listed below presented to follow-up on chronic medical problems and also with complaints of right-sided abdominal pain.  Please see problem based charting for further details.  Past Medical History:  Diagnosis Date  . Abnormal urine finding 09/20/2013  . Allergic urticaria 04/10/2015  . Allergy   . Cough 10/14/2016  . Folliculitis 09/01/2017  . Foot pain, right 11/22/2017  . H/O hiatal hernia   . Health care maintenance 07/01/2013  . Hyperlipidemia   . Hypertension   . Hypokalemia 10/29/2012  . Knee effusion, left 04/02/2018  . Musculoskeletal pain 09/20/2013   Review of Systems:  As per HPI  Physical Exam:  Vitals:   11/26/19 1528 11/26/19 1534  BP: (!) 168/68 (!) 150/78  Pulse: (!) 109 93  Temp: 98.2 F (36.8 C)   TempSrc: Oral   SpO2: 100%   Weight: 171 lb 9.6 oz (77.8 kg)   Height: 5\' 5"  (1.651 m)    Physical Exam Vitals reviewed. Exam conducted with a chaperone present.  Constitutional:      General: She is not in acute distress. Cardiovascular:     Rate and Rhythm: Tachycardia present.  Pulmonary:     Effort: No respiratory distress.     Breath sounds: No wheezing.  Abdominal:     Palpations: There is no mass.     Tenderness: There is no abdominal tenderness. There is no guarding or rebound.     Hernia: No hernia is present.  Genitourinary:    General: Normal vulva.     Pubic Area: No rash.      Labia:        Right: No rash, tenderness, lesion or injury.        Left: No rash, tenderness, lesion or injury.      Vagina: No vaginal discharge (Somewhat thin white), erythema, tenderness or bleeding.     Cervix: No friability or erythema.  Neurological:     Mental Status: She is alert.  Psychiatric:        Mood and Affect: Mood normal.        Behavior: Behavior normal.     Assessment & Plan:   See Encounters Tab for problem based  charting.  Patient discussed with Dr. 

## 2019-11-26 NOTE — Patient Instructions (Signed)
Ms. Gangi,   It was a pleasure taking care of you here in the clinic today.  As we discussed, there is only a slight chance that amlodipine could be causing the hair loss so I will switch your medication to the HCTZ.  You take 1 pill once a day in addition to the lisinopril.  I am getting blood work and also an ultrasound of your abdomen to assess for the right side pain.  Take care! Dr. Dortha Schwalbe  Please call the internal medicine center clinic if you have any questions or concerns, we may be able to help and keep you from a long and expensive emergency room wait. Our clinic and after hours phone number is [phone], the best time to call is Monday through Friday 9 am to 4 pm but there is always someone available 24/7 if you have an emergency. If you need medication refills please notify your pharmacy one week in advance and they will send [place] a request.

## 2019-11-26 NOTE — Assessment & Plan Note (Signed)
Hypertension: Uncontrolled. She states that she has been experiencing hair loss on her left temple area and due to this she expresses desire to discontinue amlodipine. She states that she checked her BP at home today and it was 120s/70s. She was previously on hydrochlorothiazide however due to hypokalemia, I discontinued medication.  BP Readings from Last 3 Encounters:  11/26/19 (!) 150/78  08/12/19 (!) 149/55  06/19/19 (!) 153/78   Plan: -Discontinue amlodipine (<0.1% chance of causing alopecia) -Continue lisinopril 10 mg daily -Start hydrochlorothiazide 25 mg daily -Follow-up CMP

## 2019-11-26 NOTE — Addendum Note (Signed)
Addended by: Yvette Rack on: 11/26/2019 04:17 PM   Modules accepted: Orders

## 2019-11-27 ENCOUNTER — Telehealth: Payer: Self-pay

## 2019-11-27 ENCOUNTER — Encounter: Payer: BLUE CROSS/BLUE SHIELD | Admitting: Internal Medicine

## 2019-11-27 ENCOUNTER — Encounter: Payer: Self-pay | Admitting: Internal Medicine

## 2019-11-27 LAB — CMP14 + ANION GAP
ALT: 9 IU/L (ref 0–32)
AST: 21 IU/L (ref 0–40)
Albumin/Globulin Ratio: 1.6 (ref 1.2–2.2)
Albumin: 4.2 g/dL (ref 3.8–4.8)
Alkaline Phosphatase: 117 IU/L (ref 48–121)
Anion Gap: 15 mmol/L (ref 10.0–18.0)
BUN/Creatinine Ratio: 18 (ref 12–28)
BUN: 12 mg/dL (ref 8–27)
Bilirubin Total: <0.2 mg/dL (ref 0.0–1.2)
CO2: 23 mmol/L (ref 20–29)
Calcium: 10.1 mg/dL (ref 8.7–10.3)
Chloride: 103 mmol/L (ref 96–106)
Creatinine, Ser: 0.68 mg/dL (ref 0.57–1.00)
GFR calc Af Amer: 107 mL/min/{1.73_m2} (ref >59)
GFR calc non Af Amer: 93 mL/min/{1.73_m2} (ref >59)
Globulin, Total: 2.7 g/dL (ref 1.5–4.5)
Glucose: 91 mg/dL (ref 65–99)
Potassium: 3.8 mmol/L (ref 3.5–5.2)
Sodium: 141 mmol/L (ref 134–144)
Total Protein: 6.9 g/dL (ref 6.0–8.5)

## 2019-11-27 LAB — HEPATITIS C ANTIBODY: Hep C Virus Ab: <0.1 s/co ratio (ref 0.0–0.9)

## 2019-11-27 NOTE — Telephone Encounter (Signed)
Requesting test results, please call pt back.  

## 2019-11-27 NOTE — Progress Notes (Signed)
Unable to reach Ms. Foglesong. Labs are unremarkable. Will send a a letter.

## 2019-11-27 NOTE — Progress Notes (Signed)
Internal Medicine Clinic Attending  Case discussed with Dr. Agyei at the time of the visit.  We reviewed the resident's history and exam and pertinent patient test results.  I agree with the assessment, diagnosis, and plan of care documented in the resident's note.    

## 2019-11-28 NOTE — Telephone Encounter (Signed)
I'll call her. Thank you.

## 2019-11-28 NOTE — Telephone Encounter (Signed)
Spoke with Ms.Mcghee's niece. She mentions that Ms.Halls is currently out of the office but she was told her test results by a different provider. Unsure which provider. Discussed will attempt again at a later time.

## 2019-11-29 ENCOUNTER — Other Ambulatory Visit: Payer: Self-pay | Admitting: Internal Medicine

## 2019-11-29 LAB — CYTOLOGY - PAP
Comment: NEGATIVE
Diagnosis: NEGATIVE
High risk HPV: NEGATIVE

## 2020-01-02 ENCOUNTER — Other Ambulatory Visit: Payer: Self-pay

## 2020-01-02 ENCOUNTER — Ambulatory Visit: Payer: BLUE CROSS/BLUE SHIELD | Admitting: Internal Medicine

## 2020-01-02 DIAGNOSIS — J019 Acute sinusitis, unspecified: Secondary | ICD-10-CM

## 2020-01-02 NOTE — Progress Notes (Signed)
I contacted the patient three times for tele-health appointment. No answer. Unable to leave VM.

## 2020-01-24 ENCOUNTER — Other Ambulatory Visit: Payer: Self-pay | Admitting: Internal Medicine

## 2020-01-24 DIAGNOSIS — I10 Essential (primary) hypertension: Secondary | ICD-10-CM

## 2020-03-02 ENCOUNTER — Other Ambulatory Visit: Payer: Self-pay | Admitting: Internal Medicine

## 2020-03-02 DIAGNOSIS — I10 Essential (primary) hypertension: Secondary | ICD-10-CM

## 2020-03-31 ENCOUNTER — Other Ambulatory Visit: Payer: Self-pay | Admitting: Internal Medicine

## 2020-03-31 DIAGNOSIS — I10 Essential (primary) hypertension: Secondary | ICD-10-CM

## 2020-04-02 ENCOUNTER — Other Ambulatory Visit: Payer: Self-pay | Admitting: Internal Medicine

## 2020-05-04 ENCOUNTER — Other Ambulatory Visit: Payer: Self-pay | Admitting: Internal Medicine

## 2020-05-04 DIAGNOSIS — Z1231 Encounter for screening mammogram for malignant neoplasm of breast: Secondary | ICD-10-CM

## 2020-06-12 ENCOUNTER — Ambulatory Visit: Payer: BLUE CROSS/BLUE SHIELD

## 2020-07-09 ENCOUNTER — Ambulatory Visit
Admission: RE | Admit: 2020-07-09 | Discharge: 2020-07-09 | Disposition: A | Discharge status: Home / Self Care | Payer: Medicare Other | Source: Ambulatory Visit | Attending: Student in an Organized Health Care Education/Training Program | Admitting: Student in an Organized Health Care Education/Training Program

## 2020-07-09 ENCOUNTER — Other Ambulatory Visit: Payer: Self-pay

## 2020-07-09 ENCOUNTER — Ambulatory Visit: Payer: BLUE CROSS/BLUE SHIELD

## 2020-07-09 DIAGNOSIS — Z1231 Encounter for screening mammogram for malignant neoplasm of breast: Secondary | ICD-10-CM | POA: Diagnosis not present

## 2020-10-01 ENCOUNTER — Other Ambulatory Visit: Payer: Self-pay | Admitting: Internal Medicine

## 2020-10-01 DIAGNOSIS — I10 Essential (primary) hypertension: Secondary | ICD-10-CM

## 2020-10-05 ENCOUNTER — Other Ambulatory Visit: Payer: Self-pay | Admitting: Internal Medicine

## 2020-10-05 DIAGNOSIS — I10 Essential (primary) hypertension: Secondary | ICD-10-CM

## 2020-10-21 ENCOUNTER — Encounter: Payer: Self-pay | Admitting: *Deleted

## 2020-10-28 ENCOUNTER — Encounter: Payer: Medicare Other | Admitting: Internal Medicine

## 2020-12-03 ENCOUNTER — Encounter: Payer: Medicare Other | Admitting: Internal Medicine

## 2020-12-16 ENCOUNTER — Encounter: Payer: Medicare Other | Admitting: Internal Medicine

## 2020-12-23 ENCOUNTER — Ambulatory Visit (INDEPENDENT_AMBULATORY_CARE_PROVIDER_SITE_OTHER): Payer: Medicare Other | Admitting: Internal Medicine

## 2020-12-23 ENCOUNTER — Encounter: Payer: Self-pay | Admitting: Internal Medicine

## 2020-12-23 VITALS — BP 161/72 | HR 92 | Temp 98.6°F | Wt 167.9 lb

## 2020-12-23 DIAGNOSIS — E785 Hyperlipidemia, unspecified: Secondary | ICD-10-CM | POA: Diagnosis not present

## 2020-12-23 DIAGNOSIS — Z8709 Personal history of other diseases of the respiratory system: Secondary | ICD-10-CM

## 2020-12-23 DIAGNOSIS — R350 Frequency of micturition: Secondary | ICD-10-CM | POA: Diagnosis not present

## 2020-12-23 DIAGNOSIS — Z0000 Encounter for general adult medical examination without abnormal findings: Secondary | ICD-10-CM | POA: Diagnosis not present

## 2020-12-23 DIAGNOSIS — I10 Essential (primary) hypertension: Secondary | ICD-10-CM | POA: Diagnosis not present

## 2020-12-23 LAB — POCT URINALYSIS DIPSTICK
Bilirubin, UA: NEGATIVE
Glucose, UA: NEGATIVE
Ketones, UA: NEGATIVE
Nitrite, UA: NEGATIVE
Protein, UA: NEGATIVE
Spec Grav, UA: 1.015 (ref 1.010–1.025)
Urobilinogen, UA: 0.2 E.U./dL
pH, UA: 7 (ref 5.0–8.0)

## 2020-12-23 MED ORDER — IBUPROFEN 400 MG PO TABS
ORAL_TABLET | ORAL | 0 refills | Status: DC
Start: 2020-12-23 — End: 2020-12-23

## 2020-12-23 MED ORDER — ALBUTEROL SULFATE HFA 108 (90 BASE) MCG/ACT IN AERS: 2.0000 | INHALATION_SPRAY | Freq: Four times a day (QID) | RESPIRATORY_TRACT | 2 refills | Status: DC | PRN

## 2020-12-23 MED ORDER — HYDROCHLOROTHIAZIDE 25 MG PO TABS: 25.0000 mg | ORAL_TABLET | Freq: Every day | ORAL | 0 refills | Status: DC

## 2020-12-23 MED ORDER — LISINOPRIL 10 MG PO TABS: 10.0000 mg | ORAL_TABLET | Freq: Every day | ORAL | 3 refills | Status: DC

## 2020-12-23 MED ORDER — IBUPROFEN 400 MG PO TABS
ORAL_TABLET | ORAL | 0 refills | Status: DC
Start: 2020-12-23 — End: 2021-06-28

## 2020-12-23 NOTE — Assessment & Plan Note (Addendum)
Last lipid panel on file was drawn in December 2017. Patient not currently on statin medication. ASCVD risk based on those labs and current vitals is 35.7%. - Lipid panel drawn today - Highly likely patient will need to begin statin therapy on receipt of updated lipid panel  Addendum 12/25/2020: total cholesterol 249, LDL 173. ASCVD risk calculated to be 33.8%. Based on previous hesitancy to take statin medications, I will send in rosuvastatin 10 mg and counsel patient that this is a low but effective dose, and we can make changes over time if needed.

## 2020-12-23 NOTE — Assessment & Plan Note (Signed)
Counseled patient on care caps recommended to address today, all of which she declined: - Referral for DEXA scan - Shingles vaccine - Pneumonia vaccine

## 2020-12-23 NOTE — Progress Notes (Signed)
   CC: urinary frequency, refills  HPI:  [name] is a 65 y.o. female with past medical history as listed below who presents today for medication refills and complaining of urinary frequency. She states that she has noticed she urinates a lot though notes that she thinks it could be related to her HCTZ for blood pressure. She denies pain or burning with urination, itch, discharge, or odor. She endorses complete emptying of her bladder.  At this time she denies taking lisinopril 10 mg despite it being on her medication list. She takes an over the counter potassium supplement. She endorses healthy eating.  The patient is requesting a refill of her ibuprofen, albuterol, and now lisinopril medications at this time. She does describe episodes of hives that she manages with allergy medication and mupirocin cream. These episodes happen 4-5 times per year.  Past Medical History:  Diagnosis Date   Abnormal urine finding 09/20/2013   Allergic urticaria 04/10/2015   Allergy    Cough 10/14/2016   Folliculitis 09/01/2017   Foot pain, right 11/22/2017   H/O hiatal hernia    Health care maintenance 07/01/2013   Hyperlipidemia    Hypertension    Hypokalemia 10/29/2012   Knee effusion, left 04/02/2018   Musculoskeletal pain 09/20/2013   Review of Systems:  Review of Systems  Constitutional:  Negative for chills, fever and weight loss.  Respiratory:  Negative for shortness of breath.   Cardiovascular:  Negative for chest pain and leg swelling.  Gastrointestinal:  Negative for abdominal pain, constipation and diarrhea.  Genitourinary:  Positive for frequency. Negative for dysuria and urgency.  Skin:  Positive for itching and rash.    Physical Exam:  Vitals:   12/23/20 1508  BP: (!) 161/72  Pulse: 92  Temp: 98.6 F (37 C)  TempSrc: Oral  SpO2: 100%  Weight: 167 lb 14.4 oz (76.2 kg)   Physical Exam Vitals and nursing note reviewed.  Constitutional:      Appearance: Normal appearance. She  is normal weight.  Cardiovascular:     Rate and Rhythm: Normal rate and regular rhythm.     Heart sounds: Normal heart sounds.  Pulmonary:     Effort: Pulmonary effort is normal.     Breath sounds: Normal breath sounds.  Abdominal:     Palpations: Abdomen is soft.     Tenderness: There is no abdominal tenderness.  Skin:    General: Skin is warm and dry.     Findings: No rash.  Neurological:     Mental Status: She is alert and oriented to person, place, and time.     Assessment & Plan:   See Encounters Tab for problem based charting.  Patient seen with Dr. Mayford Knife

## 2020-12-23 NOTE — Assessment & Plan Note (Signed)
Albuterol inhaler refilled 

## 2020-12-23 NOTE — Patient Instructions (Signed)
Thank you for visiting the Internal Medicine Clinic today. It was a pleasure to meet you! Today we discussed your concerns over frequent urination and general health.  Some items we discussed that you are due for today include shingles vaccine, pneumonia vaccine, referral for DEXA scan to evaluate your bone density. At this time you do not wish to have these care items taken care of and we can discuss them at a later time.  I have ordered the following for you:  Lab orders: CMP Lipid panel  Medication changes: Please restart lisinopril 10 mg daily for your blood pressure.  Follow-up: 3 months.  Remember: If you have any questions or concerns, please call our clinic at [phone] between 9am-5pm and after hours call [phone] and ask for the internal medicine resident on call. If you feel you are having a medical emergency please call 911.  Champ Mungo, DO

## 2020-12-23 NOTE — Assessment & Plan Note (Signed)
Hypertensive today at 161/72. Patient's trend in office visits tends to be around 140s-150s/70s. She states that her pressures are lower at home. She denies taking lisinopril at this time despite it being on her medication list. - Patient agrees to restart lisinopril 10 mg daily and needs a refill at this time. - Continue HCTZ 25 mg daily - CMP labs today

## 2020-12-23 NOTE — Assessment & Plan Note (Signed)
Urinalysis showed large leukocytes but was otherwise unremarkable. Aside from frequency, patient denies any symptoms attributable to potential infection. - No further treatment at this time.

## 2020-12-24 LAB — CMP14 + ANION GAP
ALT: 9 IU/L (ref 0–32)
AST: 20 IU/L (ref 0–40)
Albumin/Globulin Ratio: 1.4 (ref 1.2–2.2)
Albumin: 4.4 g/dL (ref 3.8–4.8)
Alkaline Phosphatase: 92 IU/L (ref 44–121)
Anion Gap: 21 mmol/L — ABNORMAL HIGH (ref 10.0–18.0)
BUN/Creatinine Ratio: 20 (ref 12–28)
BUN: 13 mg/dL (ref 8–27)
Bilirubin Total: 0.3 mg/dL (ref 0.0–1.2)
CO2: 24 mmol/L (ref 20–29)
Calcium: 9.7 mg/dL (ref 8.7–10.3)
Chloride: 97 mmol/L (ref 96–106)
Creatinine, Ser: 0.65 mg/dL (ref 0.57–1.00)
Globulin, Total: 3.1 g/dL (ref 1.5–4.5)
Glucose: 93 mg/dL (ref 65–99)
Potassium: 3.8 mmol/L (ref 3.5–5.2)
Sodium: 142 mmol/L (ref 134–144)
Total Protein: 7.5 g/dL (ref 6.0–8.5)
eGFR: 98 mL/min/{1.73_m2} (ref >59)

## 2020-12-24 LAB — LIPID PANEL
Chol/HDL Ratio: 4.7 ratio — ABNORMAL HIGH (ref 0.0–4.4)
Cholesterol, Total: 249 mg/dL — ABNORMAL HIGH (ref 100–199)
HDL: 53 mg/dL (ref >39)
LDL Chol Calc (NIH): 173 mg/dL — ABNORMAL HIGH (ref 0–99)
Triglycerides: 127 mg/dL (ref 0–149)
VLDL Cholesterol Cal: 23 mg/dL (ref 5–40)

## 2020-12-25 ENCOUNTER — Telehealth: Payer: Self-pay | Admitting: Internal Medicine

## 2020-12-25 ENCOUNTER — Other Ambulatory Visit: Payer: Self-pay | Admitting: Internal Medicine

## 2020-12-25 MED ORDER — ROSUVASTATIN CALCIUM 10 MG PO TABS: 10.0000 mg | ORAL_TABLET | Freq: Every day | ORAL | 2 refills | Status: DC

## 2020-12-25 NOTE — Telephone Encounter (Signed)
Incorrectly stated that I will start her on rosuvastatin 20 mg. I am starting her dose at 10 mg.

## 2020-12-25 NOTE — Telephone Encounter (Signed)
Spoke with patient and informed her that her cholesterol is high and that I recommend starting a statin medication to decrease her levels. She was adamant about not wanting Lipitor but was agreeable to rosuvastatin. I will send rosuvastatin 20 mg at this time.

## 2020-12-27 NOTE — Progress Notes (Signed)
Internal Medicine Clinic Attending  I saw and evaluated the patient.  I personally confirmed the key portions of the history and exam documented by Dr.  Dean  and I reviewed pertinent patient test results.  The assessment, diagnosis, and plan were formulated together and I agree with the documentation in the resident's note.  

## 2020-12-31 ENCOUNTER — Other Ambulatory Visit: Payer: Self-pay | Admitting: Internal Medicine

## 2020-12-31 ENCOUNTER — Telehealth: Payer: Self-pay | Admitting: *Deleted

## 2020-12-31 DIAGNOSIS — Z8709 Personal history of other diseases of the respiratory system: Secondary | ICD-10-CM

## 2020-12-31 MED ORDER — ALBUTEROL SULFATE HFA 108 (90 BASE) MCG/ACT IN AERS: 1.0000 | INHALATION_SPRAY | Freq: Four times a day (QID) | RESPIRATORY_TRACT | 1 refills | Status: DC | PRN

## 2020-12-31 NOTE — Progress Notes (Signed)
Prescription sent for ProAir as requested by pharmacy.

## 2020-12-31 NOTE — Telephone Encounter (Signed)
Received faxed request from [facility] to change Rx from Ventolin to [employer] per insurance.

## 2021-03-08 ENCOUNTER — Other Ambulatory Visit: Payer: Self-pay | Admitting: Internal Medicine

## 2021-03-08 DIAGNOSIS — I10 Essential (primary) hypertension: Secondary | ICD-10-CM

## 2021-03-09 ENCOUNTER — Other Ambulatory Visit: Payer: Self-pay

## 2021-03-10 ENCOUNTER — Ambulatory Visit (INDEPENDENT_AMBULATORY_CARE_PROVIDER_SITE_OTHER): Payer: Medicare Other | Admitting: Internal Medicine

## 2021-03-10 DIAGNOSIS — E785 Hyperlipidemia, unspecified: Secondary | ICD-10-CM | POA: Diagnosis not present

## 2021-03-10 DIAGNOSIS — I10 Essential (primary) hypertension: Secondary | ICD-10-CM | POA: Diagnosis not present

## 2021-03-10 DIAGNOSIS — Z0000 Encounter for general adult medical examination without abnormal findings: Secondary | ICD-10-CM | POA: Diagnosis not present

## 2021-03-10 NOTE — Progress Notes (Signed)
  [facility] Internal Medicine Residency Telephone Encounter Continuity Care Appointment  HPI:  This telephone encounter was created for [name] on 03/10/2021 for the following purpose/cc  medication refills. Patient says that she is doing well today and does not have any complaints. She did recently start her lisinopril as directed which she says has been working. Her blood pressure is under good control with no readings >130. She has not yet started her cholesterol medication because she is scared of the possible side effects. She needs a refill on her HCTZ. Her only question was whether lisinopril might make her eye twitch as she has been experiencing this a lot lately.    Past Medical History:  Past Medical History:  Diagnosis Date   Abnormal urine finding 09/20/2013   Allergic urticaria 04/10/2015   Allergy    Cough 10/14/2016   Folliculitis 09/01/2017   Foot pain, right 11/22/2017   H/O hiatal hernia    Health care maintenance 07/01/2013   Hyperlipidemia    Hypertension    Hypokalemia 10/29/2012   Knee effusion, left 04/02/2018   Musculoskeletal pain 09/20/2013     ROS:  Negative for lightheadedness, dizziness   Assessment / Plan / Recommendations:  Please see A&P under problem oriented charting for assessment of the patient's acute and chronic medical conditions.  As always, pt is advised that if symptoms worsen or new symptoms arise, they should go to an urgent care facility or to to ER for further evaluation.   Consent and Medical Decision Making:  Patient seen with Dr. Heide Spark This is a telephone encounter between Caitlyn Ingram and Taraoluwa Thakur on 03/10/2021 for medication refills. The visit was conducted with the patient located at home and [facility] at [facility]. The patient's identity was confirmed using their DOB and current address. The patient has consented to being evaluated through a telephone encounter and understands the associated risks (an examination cannot  be done and the patient may need to come in for an appointment) / benefits (allows the patient to remain at home, decreasing exposure to coronavirus). I personally spent 15 minutes on medical discussion.

## 2021-03-10 NOTE — Assessment & Plan Note (Signed)
Blood pressure reportedly under better control, systolic consistently below 130.  - continue current management with HCTZ 25 daily and lisinopril 10 daily

## 2021-03-10 NOTE — Assessment & Plan Note (Signed)
Patient declined flu shot, but is UTD on her mammogram.

## 2021-03-10 NOTE — Assessment & Plan Note (Addendum)
Patient is still hesitant to start taking rosuvastatin, as she heard about people who died taking it. Discussed that this is generally a very safe medication and that we think it would be in her best interest to start it. Provided counseling that the clinic is always here if she is experiencing any side effects or problems with her medications. She says she is going to try and start taking the medication. - continue discussion on statin medication at next visit and follow up on whether she started the medication

## 2021-03-11 ENCOUNTER — Other Ambulatory Visit (INDEPENDENT_AMBULATORY_CARE_PROVIDER_SITE_OTHER): Payer: Medicare Other

## 2021-03-11 ENCOUNTER — Other Ambulatory Visit: Payer: Self-pay | Admitting: *Deleted

## 2021-03-11 DIAGNOSIS — I10 Essential (primary) hypertension: Secondary | ICD-10-CM

## 2021-03-11 MED ORDER — HYDROCHLOROTHIAZIDE 25 MG PO TABS: 25.0000 mg | ORAL_TABLET | Freq: Every day | ORAL | 0 refills | Status: DC

## 2021-03-11 NOTE — Addendum Note (Signed)
Addended by: Ree Kida on: 03/11/2021 01:14 PM   Modules accepted: Orders

## 2021-03-11 NOTE — Progress Notes (Signed)
Internal Medicine Clinic Attending  I evaluated the patient.  I personally confirmed the key portions of the history documented by Dr. Darrick Huntsman and I reviewed pertinent patient test results.  The assessment, diagnosis, and plan were formulated together and I agree with the documentation in the resident's note.

## 2021-03-12 LAB — BMP8+ANION GAP
Anion Gap: 14 mmol/L (ref 10.0–18.0)
BUN/Creatinine Ratio: 16 (ref 12–28)
BUN: 11 mg/dL (ref 8–27)
CO2: 22 mmol/L (ref 20–29)
Calcium: 8.9 mg/dL (ref 8.7–10.3)
Chloride: 107 mmol/L — ABNORMAL HIGH (ref 96–106)
Creatinine, Ser: 0.7 mg/dL (ref 0.57–1.00)
Glucose: 117 mg/dL — ABNORMAL HIGH (ref 70–99)
Potassium: 4.1 mmol/L (ref 3.5–5.2)
Sodium: 143 mmol/L (ref 134–144)
eGFR: 95 mL/min/{1.73_m2} (ref >59)

## 2021-03-16 ENCOUNTER — Other Ambulatory Visit: Payer: Self-pay

## 2021-03-16 DIAGNOSIS — I10 Essential (primary) hypertension: Secondary | ICD-10-CM

## 2021-04-16 ENCOUNTER — Ambulatory Visit (INDEPENDENT_AMBULATORY_CARE_PROVIDER_SITE_OTHER): Payer: Medicare Other | Admitting: Internal Medicine

## 2021-04-16 DIAGNOSIS — J019 Acute sinusitis, unspecified: Secondary | ICD-10-CM | POA: Diagnosis not present

## 2021-04-16 MED ORDER — AMOXICILLIN 875 MG PO TABS: 875.0000 mg | ORAL_TABLET | Freq: Two times a day (BID) | ORAL | 0 refills | Status: AC

## 2021-04-16 NOTE — Progress Notes (Signed)
  [facility] Internal Medicine Residency Telephone Encounter Continuity Care Appointment  HPI:  This telephone encounter was created for [name] on 04/16/2021 for the following purpose/cc sinus congestion.  Caitlyn Ingram endorses 11 days of sinus congestion with fullness and pressure sensation.  Symptoms have been accompanied by headache, watering of her eyes, runny nose.  She denies any fever, chills, cough, shortness of breath or chest pain.  She has been using Flonase and Zyrtec daily without any resolution in symptoms.     Past Medical History:  Past Medical History:  Diagnosis Date   Abnormal urine finding 09/20/2013   Allergic urticaria 04/10/2015   Allergy    Cough 10/14/2016   Folliculitis 09/01/2017   Foot pain, right 11/22/2017   H/O hiatal hernia    Health care maintenance 07/01/2013   Hyperlipidemia    Hypertension    Hypokalemia 10/29/2012   Knee effusion, left 04/02/2018   Musculoskeletal pain 09/20/2013     ROS:  See above for details.    Assessment / Plan / Recommendations:  Please see A&P under problem oriented charting for assessment of the patient's acute and chronic medical conditions.  As always, pt is advised that if symptoms worsen or new symptoms arise, they should go to an urgent care facility or to to ER for further evaluation.   Consent and Medical Decision Making:  Patient discussed with Dr.  Sol Ingram This is a telephone encounter between Caitlyn Ingram and Caitlyn Ingram on 04/16/2021 for sinus congestion. The visit was conducted with the patient located at home and Caitlyn Ingram at [facility]. The patient's identity was confirmed using their DOB and current address. The patient has consented to being evaluated through a telephone encounter and understands the associated risks (an examination cannot be done and the patient may need to come in for an appointment) / benefits (allows the patient to remain at home, decreasing exposure to coronavirus). I personally  spent 10 minutes on medical discussion.

## 2021-04-16 NOTE — Assessment & Plan Note (Addendum)
History consistent with sinusitis. Unable to perform examination as appointment is via telephone. No home COVID test performed, however patient is on Day 11 of symptoms. She is out of the window for treatment and isolation. Given symptoms have lasted over 7 days, will treat with antibiotics.   - Start Amoxicillin 875 mg BID for 5 days - Follow up if no resolution

## 2021-04-19 NOTE — Progress Notes (Signed)
Internal Medicine Clinic Attending  Case discussed with Dr. Basaraba  At the time of the visit.  We reviewed the resident's history and exam and pertinent patient test results.  I agree with the assessment, diagnosis, and plan of care documented in the resident's note.  

## 2021-04-19 NOTE — Addendum Note (Signed)
Addended by: Bufford Spikes on: 04/19/2021 03:22 PM   Modules accepted: Orders

## 2021-05-04 ENCOUNTER — Encounter: Payer: Medicare Other | Admitting: Internal Medicine

## 2021-05-14 ENCOUNTER — Other Ambulatory Visit: Payer: Self-pay | Admitting: Internal Medicine

## 2021-05-14 DIAGNOSIS — I10 Essential (primary) hypertension: Secondary | ICD-10-CM

## 2021-06-17 ENCOUNTER — Encounter: Payer: Medicare Other | Admitting: Internal Medicine

## 2021-06-29 ENCOUNTER — Ambulatory Visit (INDEPENDENT_AMBULATORY_CARE_PROVIDER_SITE_OTHER): Payer: Medicare Other | Admitting: Internal Medicine

## 2021-06-29 VITALS — BP 182/78 | HR 113 | Temp 98.6°F | Wt 171.5 lb

## 2021-06-29 DIAGNOSIS — Z720 Tobacco use: Secondary | ICD-10-CM | POA: Diagnosis not present

## 2021-06-29 DIAGNOSIS — I10 Essential (primary) hypertension: Secondary | ICD-10-CM | POA: Diagnosis not present

## 2021-06-29 MED ORDER — SPIRONOLACTONE 25 MG PO TABS: 25.0000 mg | ORAL_TABLET | Freq: Every day | ORAL | 1 refills | Status: DC

## 2021-06-29 MED ORDER — IBUPROFEN 400 MG PO TABS: ORAL_TABLET | ORAL | 0 refills | Status: DC

## 2021-06-29 MED ORDER — LISINOPRIL 5 MG PO TABS: 5.0000 mg | ORAL_TABLET | Freq: Every day | ORAL | 11 refills | Status: DC

## 2021-06-30 ENCOUNTER — Other Ambulatory Visit: Payer: Self-pay | Admitting: Internal Medicine

## 2021-06-30 DIAGNOSIS — E876 Hypokalemia: Secondary | ICD-10-CM

## 2021-06-30 MED ORDER — POTASSIUM CHLORIDE 20 MEQ PO PACK: 40.0000 meq | PACK | Freq: Every day | ORAL | 0 refills | Status: DC

## 2021-06-30 NOTE — Assessment & Plan Note (Signed)
K 3.1 on labs from her office visit. Discussed with patient. She requests potassium packets, can not tolerate the pills. I have sent in a 7d supply of 53meq of potassium packets. She did not seem to want to come back for follow up labs next week and then return in 2 weeks for blood pressure follow up so will have her return for an office visit next week at which time we can recheck her potassium.

## 2021-06-30 NOTE — Progress Notes (Signed)
Pt notified of BMP results and follow up recommendations as noted under problem based charting.

## 2021-07-01 ENCOUNTER — Telehealth: Payer: Self-pay | Admitting: *Deleted

## 2021-07-01 MED ORDER — POTASSIUM CHLORIDE 20 MEQ PO PACK: 40.0000 meq | PACK | Freq: Every day | ORAL | 0 refills | Status: DC

## 2021-07-01 NOTE — Assessment & Plan Note (Signed)
She is not interested in smoking cessation at this time. 

## 2021-07-01 NOTE — Progress Notes (Signed)
Internal Medicine Clinic Attending  Case discussed with Dr. Christian  At the time of the visit.  We reviewed the resident's history and exam and pertinent patient test results.  I agree with the assessment, diagnosis, and plan of care documented in the resident's note.  

## 2021-07-01 NOTE — Assessment & Plan Note (Addendum)
Current prescriptions: hctz 25mg , lisinopril 10mg  She reports that she developed dizziness and orthostatic symptoms after starting lisinopril and has discontinued it.  Developed hair thinning in the past with amlodipine.  Blood pressure is largely above goal at her visit today--182/78. She reports that she has a blood pressure cuff at home, with her systolic pressures ranging 130-140s mmHg. She did not bring her blood pressure cuff or BP log book with to her appointment today. Note that tachycardia noted on initial vitals resolved by time of physical exam  It appears that, even in the past, when on multiple agents, blood pressure was fairly uncontrolled. Discussed pursuing a resistant hypertension evaluation. She is willing to start with a renin-aldosterone ratio but declines sleep study. Serum renin-aldo ordered  Although she may have a component of white coat hypertension, I would not expect her systolic pressures to be increased over from it. I will have her continue hctz 25mg  daily. I will have her restart lisinopril at decreased dose of 5mg . If she tolerates this, will have her add on spironolactone as well.   She is hypokalemic to 3.1 on her labs from this visit. I have sent in potassium replacement to her pharmacy. Upon speaking with her, she did not seem like she would be willing to return in 1 week for lab recheck and then one week after that for blood pressure recheck so I will have her return in one week for an office visit so we can see how she is tolerating our medication adjustments and recheck labs at the same time  Referred to [facility] for nutritional assessment

## 2021-07-01 NOTE — Telephone Encounter (Signed)
[facility] Pharmacist calling back for corrected Rx.

## 2021-07-01 NOTE — Telephone Encounter (Signed)
There was no order for 40 meq packets, so I changed the number of packets.  Please let me know if this works.  Thanks

## 2021-07-01 NOTE — Progress Notes (Signed)
° °  Office Visit   Patient ID: Caitlyn Ingram, female    DOB: Nov 07, 1955, 66 y.o.   MRN: [id number]   PCP: Champ Mungo, DO   Subjective:   Caitlyn Ingram is a 66 y.o. year old female who presents for blood pressure follow up. Please refer to problem based charting for HPI details, assessment and plan.   Objective:   BP (!) 182/78 (BP Location: Right Arm, Cuff Size: Normal)    Pulse (!) 113    Temp 98.6 F (37 C) (Oral)    Wt 171 lb 8 oz (77.8 kg)    SpO2 100%    BMI 28.54 kg/m   General: well appearing, no distress Cardiac: RRR, no LE edema Pulm: lungs clear throughout  Assessment & Plan:    Essential hypertension - Primary    Current prescriptions: hctz 25mg , lisinopril 10mg  She reports that she developed dizziness and orthostatic symptoms after starting lisinopril and has discontinued it.  Developed hair thinning in the past with amlodipine.  Blood pressure is largely above goal at her visit today--182/78. She reports that she has a blood pressure cuff at home, with her systolic pressures ranging 130-140s mmHg. She did not bring her blood pressure cuff or BP log book with to her appointment today. Note that tachycardia noted on initial vitals resolved by time of physical exam It appears that, even in the past, when on multiple agents, blood pressure was fairly uncontrolled. Discussed pursuing a resistant hypertension evaluation. She is willing to start with a renin-aldosterone ratio but declines sleep study. Serum renin-aldo ordered Although she may have a component of white coat hypertension, I would not expect her systolic pressures to be increased over from it. I will have her continue hctz 25mg  daily. I will have her restart lisinopril at decreased dose of 5mg . If she tolerates this, will have her add on spironolactone as well.  She is hypokalemic to 3.1 on her labs from this visit. I have sent in potassium replacement to her pharmacy. Upon speaking with her, she did not seem  like she would be willing to return in 1 week for lab recheck and then one week after that for blood pressure recheck so I will have her return in one week for an office visit so we can see how she is tolerating our medication adjustments and recheck labs at the same time Referred to 05-05-2006 for nutritional assessment      Tobacco abuse    She is not interested in smoking cessation at this time.      Return in about 2 weeks (around 07/13/2021) for Blood pressure follow up.   Pt discussed with Dr. , MD Internal Medicine Resident PGY-3 Internal Medicine Residency 07/01/2021 5:45 AM

## 2021-07-01 NOTE — Telephone Encounter (Signed)
Caitlyn Ingram from [facility] called in asking for clarification on Rx for Klor-con sent 2/1. 20 Meq was sent but Sig states take 40 Meq daily. Orders note states: 7d supply of of potassium packets. Please send new Rx for 40 Meq packets.

## 2021-07-05 LAB — BASIC METABOLIC PANEL
BUN/Creatinine Ratio: 22 (ref 12–28)
BUN: 15 mg/dL (ref 8–27)
CO2: 24 mmol/L (ref 20–29)
Calcium: 9.6 mg/dL (ref 8.7–10.3)
Chloride: 97 mmol/L (ref 96–106)
Creatinine, Ser: 0.69 mg/dL (ref 0.57–1.00)
Glucose: 116 mg/dL — ABNORMAL HIGH (ref 70–99)
Potassium: 3.1 mmol/L — ABNORMAL LOW (ref 3.5–5.2)
Sodium: 138 mmol/L (ref 134–144)
eGFR: 96 mL/min/{1.73_m2} (ref >59)

## 2021-07-05 LAB — ALDOSTERONE + RENIN ACTIVITY W/ RATIO
ALDOS/RENIN RATIO: 2 (ref 0.0–30.0)
ALDOSTERONE: 1.5 ng/dL (ref 0.0–30.0)
Renin: 0.754 ng/mL/hr (ref 0.167–5.380)

## 2021-07-13 ENCOUNTER — Encounter: Payer: Self-pay | Admitting: Student

## 2021-07-13 ENCOUNTER — Ambulatory Visit (INDEPENDENT_AMBULATORY_CARE_PROVIDER_SITE_OTHER): Payer: Medicare Other | Admitting: Student

## 2021-07-13 ENCOUNTER — Other Ambulatory Visit: Payer: Self-pay

## 2021-07-13 VITALS — BP 168/80 | Temp 98.3°F | Ht 65.0 in | Wt 172.0 lb

## 2021-07-13 DIAGNOSIS — I10 Essential (primary) hypertension: Secondary | ICD-10-CM | POA: Diagnosis not present

## 2021-07-13 DIAGNOSIS — E876 Hypokalemia: Secondary | ICD-10-CM

## 2021-07-13 DIAGNOSIS — E785 Hyperlipidemia, unspecified: Secondary | ICD-10-CM

## 2021-07-13 MED ORDER — HYDROCHLOROTHIAZIDE 25 MG PO TABS: 25.0000 mg | ORAL_TABLET | Freq: Every day | ORAL | 0 refills | Status: DC

## 2021-07-13 MED ORDER — EZETIMIBE 10 MG PO TABS: 10.0000 mg | ORAL_TABLET | Freq: Every day | ORAL | 1 refills | Status: DC

## 2021-07-13 NOTE — Assessment & Plan Note (Addendum)
Patient was noted to be hypokalemic to 3.1 on labs during last office visit.  She was prescribed potassium packets (40 mEq daily) for 7-day course.  Discussed repeating BMP today to ensure resolution of hypokalemia.  Patient was also prescribed spironolactone during last office visit to help with both hypertension and hypokalemia.   Plan: -Follow-up BMP -Continue spironolactone -Patient prefers potassium packets for supplementation if needed as she cannot tolerate the pills  Addendum: Patient's BMP showing potassium of 3.2.  Spoke with patient and advised additional potassium supplementation.  Refilled potassium packets (40 mEq daily) for 7-day course to help with hypokalemia.  She has yet to pick up her prescription of spironolactone as well which I discussed will help bring her potassium to goal.

## 2021-07-13 NOTE — Progress Notes (Signed)
° °  CC: Follow-up of hypertension, hypokalemia, hyperlipidemia  HPI:  Caitlyn Ingram is a 66 y.o. female with history listed below presenting to the [facility] for follow-up of hypertension, hypokalemia, hyperlipidemia. Please see individualized problem based charting for full HPI.  Past Medical History:  Diagnosis Date   Abnormal urine finding 09/20/2013   Allergic urticaria 04/10/2015   Allergy    Cough 10/14/2016   Folliculitis 09/01/2017   Foot pain, right 11/22/2017   H/O hiatal hernia    Health care maintenance 07/01/2013   Hyperlipidemia    Hypertension    Hypokalemia 10/29/2012   Knee effusion, left 04/02/2018   Musculoskeletal pain 09/20/2013    Review of Systems:  Negative aside from that listed in individualized problem based charting.  Physical Exam:  Vitals:   07/13/21 1521 07/13/21 1554  BP:  (!) 168/80  Temp: 98.3 F (36.8 C)   TempSrc: Oral   SpO2: 100%   Weight: 172 lb (78 kg)   Height: 5\' 5"  (1.651 m)    Physical Exam Constitutional:      Appearance: Normal appearance. She is not ill-appearing.  HENT:     Mouth/Throat:     Mouth: Mucous membranes are moist.     Pharynx: Oropharynx is clear.  Eyes:     Extraocular Movements: Extraocular movements intact.     Conjunctiva/sclera: Conjunctivae normal.     Pupils: Pupils are equal, round, and reactive to light.  Cardiovascular:     Rate and Rhythm: Normal rate and regular rhythm.     Pulses: Normal pulses.     Heart sounds: Normal heart sounds. No murmur heard.   No gallop.  Pulmonary:     Effort: Pulmonary effort is normal.     Breath sounds: Normal breath sounds. No wheezing, rhonchi or rales.  Abdominal:     General: Bowel sounds are normal. There is no distension.     Palpations: Abdomen is soft.     Tenderness: There is no abdominal tenderness.  Musculoskeletal:        General: No swelling. Normal range of motion.     Cervical back: Normal range of motion.  Skin:    General: Skin is warm and dry.   Neurological:     General: No focal deficit present.     Mental Status: She is alert and oriented to person, place, and time.  Psychiatric:        Mood and Affect: Mood normal.        Behavior: Behavior normal.     Assessment & Plan:   See Encounters Tab for problem based charting.  Patient discussed with Dr.  

## 2021-07-13 NOTE — Assessment & Plan Note (Signed)
Patient with history of uncontrolled hypertension, currently on hydrochlorothiazide 25 mg and lisinopril 5 mg daily.  She was also started on spironolactone during last visit given concomitant hypokalemia and suspicion for primary aldosteronism, but she reports that she has not started taking that.  Her blood pressure today is significantly elevated with initial reading of 190/72 and repeat of 168/80.  She is asymptomatic.  She states that she suffers from whitecoat hypertension.  States that her home blood pressure readings are typically in the 120s to 140s systolics, but unfortunately she forgot to bring her blood pressure log.  She was evaluated for resistant hypertension with renin aldosterone level.  However this was not consistent with primary aldosteronism.  It appears that she was unable to tolerate lisinopril 10 mg in the past due to a cough and generally feeling unwell, but she is able to tolerate lisinopril 5 mg daily.  Discussed transition to losartan instead so that we can titrate up if needed without needing to worry about adverse effects.  However, patient states that she does not want to transition to losartan and wishes to stay on lisinopril as her home blood pressure readings have been good.  It also appears that patient was unable to tolerate Norvasc in the past due to thinning of her hair.  She is amenable to continuing HCTZ 25 mg daily, lisinopril 5 mg daily, and starting spironolactone 25 mg daily.  States that she will keep a blood pressure log at home and bring it to her next visit in 2 weeks.  Plan: -Continue HCTZ 25 mg daily, lisinopril 5 mg daily -Start spironolactone 25 mg daily -Keep home blood pressure log and bring to next visit in 2 weeks

## 2021-07-13 NOTE — Assessment & Plan Note (Signed)
Patient with history of hyperlipidemia, not currently on any medications.  She was prescribed Crestor but does not wish to take it because she fears having adverse effects that she heard from her friends who are on it.  However she is amenable to trying an alternative cholesterol medication.  Discussed initiation of Zetia for management of hyperlipidemia, which she is agreeable to.  Plan: -Discontinued Crestor -Prescribed Zetia 10 mg daily -Repeat lipid panel in 6 to 12 months

## 2021-07-13 NOTE — Patient Instructions (Signed)
Caitlyn Ingram,  It was a pleasure seeing you in the clinic today.   I have refilled your hydrochlorothiazide. I have prescribed another medication to help with your cholesterol. It is called Ezetimibe (also called Zetia). Please pick this up from your pharmacy. We are checking your electrolytes today. I will call you with the results. Please make sure to bring your blood pressure log to your next visit in 2 weeks.  Please call our clinic at [phone] if you have any questions or concerns. The best time to call is Monday-Friday from 9am-4pm, but there is someone available 24/7 at the same number. If you need medication refills, please notify your pharmacy one week in advance and they will send [place] a request.   Thank you for letting us take part in your care. We look forward to seeing you next time!

## 2021-07-14 LAB — BMP8+ANION GAP
Anion Gap: 16 mmol/L (ref 10.0–18.0)
BUN/Creatinine Ratio: 17 (ref 12–28)
BUN: 11 mg/dL (ref 8–27)
CO2: 25 mmol/L (ref 20–29)
Calcium: 10.2 mg/dL (ref 8.7–10.3)
Chloride: 101 mmol/L (ref 96–106)
Creatinine, Ser: 0.65 mg/dL (ref 0.57–1.00)
Glucose: 102 mg/dL — ABNORMAL HIGH (ref 70–99)
Potassium: 3.2 mmol/L — ABNORMAL LOW (ref 3.5–5.2)
Sodium: 142 mmol/L (ref 134–144)
eGFR: 97 mL/min/{1.73_m2} (ref >59)

## 2021-07-15 MED ORDER — POTASSIUM CHLORIDE 20 MEQ PO PACK: 40.0000 meq | PACK | Freq: Every day | ORAL | 0 refills | Status: DC

## 2021-07-15 NOTE — Addendum Note (Signed)
Addended by: Merrilyn Puma on: 07/15/2021 02:44 PM   Modules accepted: Orders

## 2021-07-19 NOTE — Progress Notes (Signed)
Internal Medicine Clinic Attending  Case discussed with Dr. Jinwala  At the time of the visit.  We reviewed the resident's history and exam and pertinent patient test results.  I agree with the assessment, diagnosis, and plan of care documented in the resident's note.  

## 2021-07-23 ENCOUNTER — Other Ambulatory Visit: Payer: Self-pay

## 2021-07-26 ENCOUNTER — Ambulatory Visit (INDEPENDENT_AMBULATORY_CARE_PROVIDER_SITE_OTHER): Payer: Medicare Other | Admitting: Internal Medicine

## 2021-07-26 ENCOUNTER — Other Ambulatory Visit: Payer: Self-pay | Admitting: Student

## 2021-07-26 ENCOUNTER — Encounter: Payer: Self-pay | Admitting: Internal Medicine

## 2021-07-26 VITALS — BP 146/68 | HR 109 | Temp 98.4°F | Ht 65.0 in | Wt 169.4 lb

## 2021-07-26 DIAGNOSIS — Z0000 Encounter for general adult medical examination without abnormal findings: Secondary | ICD-10-CM

## 2021-07-26 DIAGNOSIS — E785 Hyperlipidemia, unspecified: Secondary | ICD-10-CM | POA: Diagnosis not present

## 2021-07-26 DIAGNOSIS — E876 Hypokalemia: Secondary | ICD-10-CM

## 2021-07-26 DIAGNOSIS — I10 Essential (primary) hypertension: Secondary | ICD-10-CM

## 2021-07-26 MED ORDER — LISINOPRIL 5 MG PO TABS: 5.0000 mg | ORAL_TABLET | Freq: Every day | ORAL | 1 refills | Status: DC

## 2021-07-26 MED ORDER — ROSUVASTATIN CALCIUM 5 MG PO TABS: 5.0000 mg | ORAL_TABLET | Freq: Every day | ORAL | 1 refills | Status: DC

## 2021-07-26 MED ORDER — HYDROCHLOROTHIAZIDE 25 MG PO TABS: 25.0000 mg | ORAL_TABLET | Freq: Every day | ORAL | 1 refills | Status: DC

## 2021-07-26 NOTE — Assessment & Plan Note (Addendum)
Patient noted to be hypokalemic to 3.2 at her last office visit. She was given potassium packets and advised to take 40 mEq daily, which she has been doing, although she ran out today. She also was prescribed spironolactone to help with hypertension and hypokalemia, however, she has not been taking this. Will recheck BMP today to ensure resolution of hypokalemia.   Plan: - Follow up BMP - Hold off on refilling potassium supplement until BMP results  Addendum: K still low at 3.1. Called patient and made aware. Sent in [employer] packets 40 mEq to be taken daily. Recommended she follow up in a few weeks to get a repeat BMP again with her PCP.

## 2021-07-26 NOTE — Assessment & Plan Note (Signed)
Declining shingrix vaccine- states that she will never get this.

## 2021-07-26 NOTE — Assessment & Plan Note (Signed)
Patient presented for a 2 week follow up of her hypertension. At her last visit 2 weeks ago, she was continued on her lisinopril 5 mg daily and hctz 25 mg, and was started on spiro 25 mg daily. Of note, the patient has been unable to tolerate any lisinopril dose beyond 5 mg due to a cough and also has not been able to tolerate amlodipine in the past, due to thinning of her hair. She did not start spironolactone as previously advised to, because she states that she does not like to take medications. The patient did bring in a BP log today and all of her BP readings have been between [id number] systolic, aside from 1-2 readings of SBP in the 130s. The patient states that she suffers from white coat hypertension and that her BP is never high, except when she is here.   Today, her BP was initially elevated to 182/97, reduced to 146/68 upon recheck. She denies any headache, blurry vision, dizziness, chest pain, palpitations, SOB, or any other sxs at this time. Advised patient to continue to keep a log of her BP, and if she notices her BP getting higher at home, to be seen again, as she may need another BP medication at that time. Until then, will continue on lisinopril and hctz- will discontinue spiro at this time, as the patient has not even been taking it and BP is well controlled at home.   Plan: - Continue lisinopril 5 mg and hctz 25 mg daily - Keep BP log at home

## 2021-07-26 NOTE — Telephone Encounter (Signed)
Pt has an appt today with Dr Atway. 

## 2021-07-26 NOTE — Assessment & Plan Note (Signed)
Patient has not been on any medications for her hyperlipidemia, as she had fears of side effects to crestor/any statin medication. She was prescribed Zetia at her last visit, however, she did not start this (patient dislikes taking any medications). The patient states that after talking with her friend, she is now agreeable to starting a statin medication, if we start at a low dose.  Plan: -  Start crestor 5 mg daily

## 2021-07-26 NOTE — Patient Instructions (Signed)
Thank you, [name] for allowing [place] to provide your care today. Today we discussed:  High blood pressure: Continue to check your blood pressure at home daily- I am glad your pressures are much lower at home! Keep taking lisinopril 5 mg daily and hydrochlorothiazide 25 mg daily. You do not need to take spironolactone.   Low potassium: Rechecking your potassium today. I will send in the supplements, if your level is still low  Cholesterol: Start taking crestor 5 mg daily. Keeping your cholesterol low will reduce your risk of heart attacks or strokes  I have ordered the following labs for you:   Lab Orders         BMP8+Anion Gap      Tests ordered today:  none  Referrals ordered today:   Referral Orders  No referral(s) requested today     I have ordered the following medication/changed the following medications:   Stop the following medications: Medications Discontinued During This Encounter  Medication Reason   lisinopril (ZESTRIL) 5 MG tablet Reorder   hydrochlorothiazide (HYDRODIURIL) 25 MG tablet Reorder   spironolactone (ALDACTONE) 25 MG tablet      Start the following medications: Meds ordered this encounter  Medications   lisinopril (ZESTRIL) 5 MG tablet    Sig: Take 1 tablet (5 mg total) by mouth daily.    Dispense:  90 tablet    Refill:  1   rosuvastatin (CRESTOR) 5 MG tablet    Sig: Take 1 tablet (5 mg total) by mouth daily.    Dispense:  90 tablet    Refill:  1   hydrochlorothiazide (HYDRODIURIL) 25 MG tablet    Sig: Take 1 tablet (25 mg total) by mouth daily.    Dispense:  90 tablet    Refill:  1     Follow up: 4-6 months for BP follow up   Remember: to not take spironolactone anymore  Should you have any questions or concerns please call the internal medicine clinic at [phone].     Elza Rafter, D.O. [facility]

## 2021-07-26 NOTE — Progress Notes (Signed)
° °  CC: 2 week BP follow up  HPI:  Caitlyn Ingram is a 66 y.o. female with HTN and HLD who presents to the [facility] for a 2 week blood pressure follow up appointment. Please see problem-based list for further details, assessments, and plans.   Past Medical History:  Diagnosis Date   Abnormal urine finding 09/20/2013   Allergic urticaria 04/10/2015   Allergy    Cough [id number]   Folliculitis 0000000   Foot pain, right 11/22/2017   H/O hiatal hernia    Health care maintenance 07/01/2013   Hyperlipidemia    Hypertension    Hypokalemia 10/29/2012   Knee effusion, left 04/02/2018   Musculoskeletal pain 09/20/2013   Review of Systems:  Review of Systems  Eyes:  Negative for blurred vision.  Respiratory:  Negative for shortness of breath.   Cardiovascular:  Negative for chest pain and palpitations.  Gastrointestinal:  Negative for diarrhea, nausea and vomiting.  Musculoskeletal: Negative.   Neurological:  Negative for dizziness and headaches.    Physical Exam:  Vitals:   07/26/21 1431 07/26/21 1450  BP: (!) 182/97 (!) 146/68  Pulse: (!) 116 (!) 109  Temp: 98.4 F (36.9 C)   TempSrc: Oral   SpO2: 100%   Weight: 169 lb 6.4 oz (76.8 kg)   Height: 5\' 5"  (1.651 m)    General: Pleasant, well-appearing female. No acute distress. CV: RRR. No murmur Pulmonary: Lungs CTAB. Normal effort.  Abdominal: Soft, nontender, nondistended.  Skin: Warm and dry.  Neuro: A&Ox3.  No focal deficit. Psych: Normal mood and affect   Assessment & Plan:   See Encounters Tab for problem based charting.  Patient discussed with Dr. Daryll Drown

## 2021-07-27 MED ORDER — POTASSIUM CHLORIDE 20 MEQ PO PACK: 40.0000 meq | PACK | Freq: Every day | ORAL | 0 refills | Status: DC

## 2021-07-27 MED ORDER — POTASSIUM CHLORIDE 20 MEQ PO PACK: 40.0000 meq | PACK | Freq: Every day | ORAL | 1 refills | Status: DC

## 2021-07-27 NOTE — Addendum Note (Signed)
Addended by: Elza Rafter on: 07/27/2021 09:28 AM   Modules accepted: Orders

## 2021-07-27 NOTE — Addendum Note (Signed)
Addended by: Elza Rafter on: 07/27/2021 01:14 PM   Modules accepted: Orders

## 2021-07-29 ENCOUNTER — Telehealth: Payer: Medicare Other | Admitting: Dietician

## 2021-07-29 ENCOUNTER — Telehealth: Payer: Self-pay | Admitting: Dietician

## 2021-07-29 NOTE — Telephone Encounter (Signed)
Patient cancelled her visit but agreed to information about potassium to be mailed.  ?

## 2021-07-30 ENCOUNTER — Telehealth: Payer: Self-pay | Admitting: *Deleted

## 2021-07-30 LAB — BMP8+ANION GAP
Anion Gap: 18 mmol/L (ref 10.0–18.0)
BUN/Creatinine Ratio: 18 (ref 12–28)
BUN: 11 mg/dL (ref 8–27)
CO2: 24 mmol/L (ref 20–29)
Calcium: 10.1 mg/dL (ref 8.7–10.3)
Chloride: 100 mmol/L (ref 96–106)
Creatinine, Ser: 0.61 mg/dL (ref 0.57–1.00)
Glucose: 97 mg/dL (ref 70–99)
Potassium: 3.1 mmol/L — ABNORMAL LOW (ref 3.5–5.2)
Sodium: 142 mmol/L (ref 134–144)
eGFR: 99 mL/min/{1.73_m2} (ref >59)

## 2021-07-30 NOTE — Telephone Encounter (Signed)
Received faxed result of BMP drawn 2/27. Potassium 3.1. Previous potassium 3.2 on 2/14. Will hand to [employer]. ?

## 2021-08-09 ENCOUNTER — Telehealth: Payer: Self-pay

## 2021-08-09 NOTE — Progress Notes (Signed)
Internal Medicine Clinic Attending ° °Case discussed with Dr. Atway  at the time of the visit.  We reviewed the resident’s history and exam and pertinent patient test results.  I agree with the assessment, diagnosis, and plan of care documented in the resident’s note.  °

## 2021-08-09 NOTE — Telephone Encounter (Signed)
potassium chloride (KLOR-CON) 20 MEQ packet, REFILL REQUEST @ [address] [facility] [address] - [place], [place] - [address] ?

## 2021-08-09 NOTE — Telephone Encounter (Signed)
28 packets with 1 refill sent to [facility] on 07/27/21. Call placed to patient. States she was told to take 2 packs per day so she is completely out. Patient should still have 1 refill left. Call placed to [facility] at [place]. She will get this refill ready now. Patient has f/u appt on 4/3 with PCP to have repeat BMP. ?

## 2021-08-24 ENCOUNTER — Other Ambulatory Visit: Payer: Self-pay | Admitting: Internal Medicine

## 2021-08-24 DIAGNOSIS — Z1231 Encounter for screening mammogram for malignant neoplasm of breast: Secondary | ICD-10-CM

## 2021-08-30 ENCOUNTER — Encounter: Payer: Self-pay | Admitting: Internal Medicine

## 2021-08-30 ENCOUNTER — Ambulatory Visit (INDEPENDENT_AMBULATORY_CARE_PROVIDER_SITE_OTHER): Payer: Medicare Other | Admitting: Internal Medicine

## 2021-08-30 VITALS — BP 159/73 | HR 113 | Temp 98.8°F | Wt 168.4 lb

## 2021-08-30 DIAGNOSIS — I10 Essential (primary) hypertension: Secondary | ICD-10-CM

## 2021-08-30 DIAGNOSIS — E876 Hypokalemia: Secondary | ICD-10-CM

## 2021-08-30 DIAGNOSIS — E785 Hyperlipidemia, unspecified: Secondary | ICD-10-CM | POA: Diagnosis not present

## 2021-08-30 DIAGNOSIS — Z720 Tobacco use: Secondary | ICD-10-CM | POA: Diagnosis not present

## 2021-08-30 NOTE — Progress Notes (Signed)
? ?  CC: hypokalemia re-check ? ?HPI: ? ?[name] is a 66 y.o. female with past medical history as detailed below who presents today for 2-week follow up for improvement of hypokalemia. Please see problem based charting for detailed assessment and plan. ? ?Past Medical History:  ?Diagnosis Date  ? Abnormal urine finding 09/20/2013  ? Allergic urticaria 04/10/2015  ? Allergy   ? Cough 10/14/2016  ? Folliculitis 0000000  ? Foot pain, right 11/22/2017  ? H/O bronchitis 08/19/2011  ? Managed with albuterol inhaler  ? H/O hiatal hernia   ? Health care maintenance 07/01/2013  ? Healthcare maintenance 07/01/2013  ? Hyperlipidemia   ? Hypertension   ? Hypokalemia 10/29/2012  ? Knee effusion, left 04/02/2018  ? Musculoskeletal pain 09/20/2013  ? Tendinopathy of right rotator cuff 10/02/2017  ? ?Review of Systems:   ?Review of Systems  ?Constitutional:  Negative for malaise/fatigue.  ?Respiratory:  Negative for shortness of breath.   ?Cardiovascular:  Negative for chest pain, palpitations, claudication and leg swelling.  ?Gastrointestinal:  Negative for abdominal pain.  ?Musculoskeletal:  Positive for myalgias. Negative for falls.  ?Neurological:  Negative for tingling, sensory change and weakness.  ? ? ?Physical Exam: ? ?Vitals:  ? 08/30/21 1604  ?BP: (!) 159/73  ?Pulse: (!) 113  ?Temp: 98.8 ?F (37.1 ?C)  ?TempSrc: Oral  ?SpO2: 100%  ?Weight: 168 lb 6.4 oz (76.4 kg)  ? ?Constitutional:Well-appearing female in no acute distress. ?Cardio:Regular rate and rhythm. No murmurs, rubs, gallops. ?Pulm:Clear to auscultation bilaterally. Decreased breath sounds. ?Abdomen:Soft, nontender, nondistended. ?[id number] for extremity edema. No tenderness to palpation of LLE or L knee. No swelling or effusion to L knee joint. No crepitus on passive ROM to L knee. ?Skin:Warm and dry. ?Neuro:Alert and oriented x3. No focal deficit noted. ?Psych:Pleasant mood and affect. ? ?Assessment & Plan:  ? ?See Encounters Tab for problem based  charting. ? ?Patient discussed with Dr. Philipp Ovens ? ?

## 2021-08-30 NOTE — Patient Instructions (Addendum)
Ms. Roell, ? ?It was great to see you again! Today we discussed your recent low potassium levels as well as your high blood pressure and high cholesterol. I want you to know that I am so proud of you for taking your cholesterol and blood pressure medications--I know that you do not like taking pills so this is a huge feat! ? ?I will call you with the results of your lab work today and let you know if we need to start additional potassium for you, or if any other testing is needed overall. ? ?Remember: If you have any questions or concerns, please call our clinic at [phone] between 9am-5pm and after hours call [phone] and ask for the internal medicine resident on call. If you feel you are having a medical emergency please call 911. ? ?Champ Mungo, DO ? ?

## 2021-08-31 ENCOUNTER — Encounter: Payer: Self-pay | Admitting: Internal Medicine

## 2021-08-31 NOTE — Assessment & Plan Note (Addendum)
Assessment: Patient is hypertensive through triage at 159/73 though she reports home BP measuring within normal range, specifically that highest SBP she can recall of 136. She recently stopped HCTZ on her own due to concerns of hypokalemia after doing some research on her own and speaking with some friends.  She continues to take lisinopril without difficulty.  ?Plan: Given persistent hypokalemia and uncontrolled hypertension I will check plasma renin and aldosterone to rule out hyperaldosteronism. Continue lisinopril 5 mg daily. ? ?ADDENDUM: Renin and aldosterone labs showing "will follow," however on further chart review I see that these levels were recently tested and within normal limits. Will not proceed for further work-up at this time. ?

## 2021-08-31 NOTE — Assessment & Plan Note (Signed)
Assessment: Patient is tolerating rosuvastatin 5 mg daily well at this time. She has been very hesitant to try any medication for cholesterol previously. ?Plan: Continue rosuvastatin 5 mg daily. Recheck lipid panel at next visit. ?

## 2021-08-31 NOTE — Assessment & Plan Note (Addendum)
Assessment: Patient seen for two week recheck of hypokalemia. She has previously been treated with potassium supplement packets at home and she feels that when she took these it made her heart feel like it was beating oddly. She also has taken herself off of HCTZ after some of her own research and speaking with friends about possible side effect of the medication being hypokalemia. She also endorses some L leg muscle cramping around the times when her potassium was low but states that it has continued to get better and does not affect daily activities.  ?Plan: Counseled patient on dangers of both hypo and hyperkalemia. BMP, plasma renin, plasma aldosterone collected. ? ?ADDENDUM: BMP revealed potassium WNL. I will not send in further potassium supplements at this time. I have counseled her on her HCTZ prescription, letting her know that since she has not taken it for some time now, she can continue to hold that medication but that she will need to restart this if she notices her BP starting to trend upwards on home BP measurements. Recheck BMP at next visit. ?

## 2021-08-31 NOTE — Assessment & Plan Note (Signed)
Assessment: Patient reports that she is down to around 4 cigarettes per day at this time and continues to work towards further decreasing this number with ultimate goal of cessation. ?Plan: Encouraged cessation. ?

## 2021-09-01 ENCOUNTER — Ambulatory Visit: Payer: Medicare Other

## 2021-09-01 ENCOUNTER — Telehealth: Payer: Self-pay | Admitting: Internal Medicine

## 2021-09-01 NOTE — Telephone Encounter (Signed)
Spoke with patient to let her know that her lab results came back and showed her potassium level was normal. She asked what that means regarding still experiencing some leg cramping and I assured her that I did not think there was something overtly wrong such as an injury and that it may be related to overuse, twisting in an odd way, muscle aches. She will keep an eye on it and let us know at her next visit if she continues to have problems. Regarding her HCTZ prescription I advised her that she can continue to stay off of it since she has not taken it in some time as noted in encounter note, but that if she notes her blood pressure to start increasing again when she takes it at home to restart the medication. She has been advised to return for routine follow-up in 3 months. ? ?Farrel Gordon, DO ?

## 2021-09-06 ENCOUNTER — Ambulatory Visit
Admission: RE | Admit: 2021-09-06 | Discharge: 2021-09-06 | Disposition: A | Discharge status: Home / Self Care | Payer: Medicare Other | Source: Ambulatory Visit | Attending: Family Medicine | Admitting: Family Medicine

## 2021-09-06 DIAGNOSIS — Z1231 Encounter for screening mammogram for malignant neoplasm of breast: Secondary | ICD-10-CM

## 2021-09-07 NOTE — Addendum Note (Signed)
Addended by: Burnell Blanks on: 09/07/2021 09:18 AM ? ? Modules accepted: Level of Service ? ?

## 2021-09-07 NOTE — Progress Notes (Signed)
Internal Medicine Clinic Attending ? ?Case discussed with Dr. Dean  At the time of the visit.  We reviewed the resident?s history and exam and pertinent patient test results.  I agree with the assessment, diagnosis, and plan of care documented in the resident?s note.  ?

## 2021-09-08 LAB — BMP8+ANION GAP
Anion Gap: 18 mmol/L (ref 10.0–18.0)
BUN/Creatinine Ratio: 18 (ref 12–28)
BUN: 12 mg/dL (ref 8–27)
CO2: 22 mmol/L (ref 20–29)
Calcium: 9.7 mg/dL (ref 8.7–10.3)
Chloride: 102 mmol/L (ref 96–106)
Creatinine, Ser: 0.68 mg/dL (ref 0.57–1.00)
Glucose: 84 mg/dL (ref 70–99)
Potassium: 4 mmol/L (ref 3.5–5.2)
Sodium: 142 mmol/L (ref 134–144)
eGFR: 96 mL/min/{1.73_m2} (ref >59)

## 2021-09-08 LAB — ALDOSTERONE + RENIN ACTIVITY W/ RATIO
ALDOS/RENIN RATIO: 5 (ref 0.0–30.0)
ALDOSTERONE: 1.6 ng/dL (ref 0.0–30.0)
Renin: 0.323 ng/mL/hr (ref 0.167–5.380)

## 2021-09-21 ENCOUNTER — Encounter: Payer: Self-pay | Admitting: Internal Medicine

## 2021-09-21 ENCOUNTER — Ambulatory Visit (INDEPENDENT_AMBULATORY_CARE_PROVIDER_SITE_OTHER): Payer: Medicare Other | Admitting: Internal Medicine

## 2021-09-21 ENCOUNTER — Other Ambulatory Visit: Payer: Self-pay

## 2021-09-21 VITALS — BP 146/72 | HR 102 | Temp 98.3°F | Resp 24 | Ht 65.0 in | Wt 169.0 lb

## 2021-09-21 DIAGNOSIS — M1712 Unilateral primary osteoarthritis, left knee: Secondary | ICD-10-CM | POA: Diagnosis not present

## 2021-09-21 MED ORDER — DICLOFENAC SODIUM 1 % EX GEL: 2.0000 g | Freq: Four times a day (QID) | CUTANEOUS | 2 refills | Status: DC

## 2021-09-21 NOTE — Patient Instructions (Signed)
It was nice seeing you today! Thank you for choosing [facility] for your Primary Care.  ?  ?Today we talked about your knee pain. I am sorry that you are having this pain! I have sent in a cream to use on the knee. You can also continue to use heat and I advise that you continue with exercise as strengthening your leg muscles and getting extra weight has been shown to help with knee arthritis. ? ?Please follow up in 2 months for general check-up. ? ?Remember: If you have any questions or concerns, please call our clinic at [phone] between 9am-5pm and after hours call [phone] and ask for the internal medicine resident on call. If you feel you are having a medical emergency please call 911. ? ?Farrel Gordon, DO ? ?

## 2021-09-21 NOTE — Progress Notes (Signed)
? ?CC: Knee pain ? ?HPI: ? ?[name] is a 66 y.o. female with past medical history as detailed below who presents today for acute left knee pain.  Please see problem charting for detail assessment and plan. ? ?Past Medical History:  ?Diagnosis Date  ? Abnormal urine finding 09/20/2013  ? Allergic urticaria 04/10/2015  ? Allergy   ? Cough 10/14/2016  ? Folliculitis 0000000  ? Foot pain, right 11/22/2017  ? H/O bronchitis 08/19/2011  ? Managed with albuterol inhaler  ? H/O hiatal hernia   ? Health care maintenance 07/01/2013  ? Healthcare maintenance 07/01/2013  ? Hyperlipidemia   ? Hypertension   ? Hypokalemia 10/29/2012  ? Knee effusion, left 04/02/2018  ? Musculoskeletal pain 09/20/2013  ? Tendinopathy of right rotator cuff 10/02/2017  ? ?Review of Systems:   ?Review of Systems  ?Constitutional:  Negative for chills and fever.  ?Musculoskeletal:  Positive for falls and joint pain. Negative for myalgias.  ?Neurological:  Negative for focal weakness and weakness.  ? ? ?Physical Exam: ? ?Vitals:  ? 09/21/21 1507 09/21/21 1519  ?BP: (!) 157/74 (!) 146/72  ?Pulse: (!) 101 (!) 102  ?Resp: (!) 24   ?Temp: 98.3 ?F (36.8 ?C)   ?TempSrc: Oral   ?SpO2: 100%   ?Weight: 169 lb (76.7 kg)   ?Height: 5\' 5"  (1.651 m)   ? ?Constitutional: Well-appearing female in no acute distress. ?Cardio: Regular rate and rhythm.  No murmurs, rubs, gallops. ?Pulm: Clear to auscultation bilaterally. ?MSK: Full active and passive range of motion.  There is no increased warmth or edema to bilateral knee joints.  No effusion noted at bilateral knee joints.  No joint line tenderness to the left knee. ?Skin: Warm and dry. ?Neuro: Alert and oriented, no focal deficit noted. ?Psych: Normal mood and affect. ? ?Assessment & Plan:  ? ?Osteoarthritis of left knee ?Patient presents today with complaint of left knee pain that she noticed started earlier in the year when she is having trouble with her potassium levels.  Initially she thought it was muscle cramping  above and below the knee but as her potassium levels have normalized she feels that it is actually in the knee joint itself.  She reports a minor fall around 5 months ago but did not have any downtime as a result.  Otherwise there is no recent injury or trauma to the area.  She states that she hears cracking sounds when she flexes and extends the knee and that when she is ambulatory for prolonged period of time she does get pain.  Thus far she has been taking 40 mg ibuprofen every 3 days which has not helped, as well as Tylenol arthritis 650 mg 1 time which did not help. ? ?She states that she received a steroid injection in her shoulder once due to rotator cuff tendinopathy which did help the pain.  She further explains that she has friends to get steroid injections in her knees and have told her that she needs to get this. ? ?Physical exam did not show any increased warmth or edema, no effusions noted, no joint line tenderness.  She has not had any fever or chills.  She has full active and passive range of motion with normal ambulation. ?Assessment: Patient presents with clinical mild osteoarthritis that does not seem to be affecting her on a day-to-day basis.  Her conservative management to this point has been minimal.  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  No concern at this time for traumatic cause of her pain or septic joint. ?Plan: I will send in Voltaren gel for the patient to apply over the knee up to 4 times daily as needed.  I have counseled her on exercising to increase lower extremity strength as well as shed any extra weight that can contribute to further stress on the knee joint.  We will follow-up regarding her knee pain at her general checkup in 3 months and assess need for further management of her pain. ? ? ?See Encounters Tab for problem based charting. ? ?Patient discussed with Dr. Dareen Piano ? ?

## 2021-09-22 DIAGNOSIS — M170 Bilateral primary osteoarthritis of knee: Secondary | ICD-10-CM | POA: Insufficient documentation

## 2021-09-22 DIAGNOSIS — M1712 Unilateral primary osteoarthritis, left knee: Secondary | ICD-10-CM | POA: Insufficient documentation

## 2021-09-22 NOTE — Assessment & Plan Note (Signed)
Patient presents today with complaint of left knee pain that she noticed started earlier in the year when she is having trouble with her potassium levels.  Initially she thought it was muscle cramping above and below the knee but as her potassium levels have normalized she feels that it is actually in the knee joint itself.  She reports a minor fall around 5 months ago but did not have any downtime as a result.  Otherwise there is no recent injury or trauma to the area.  She states that she hears cracking sounds when she flexes and extends the knee and that when she is ambulatory for prolonged period of time she does get pain.  Thus far she has been taking 40 mg ibuprofen every 3 days which has not helped, as well as Tylenol arthritis 650 mg 1 time which did not help. ? ?She states that she received a steroid injection in her shoulder once due to rotator cuff tendinopathy which did help the pain.  She further explains that she has friends to get steroid injections in her knees and have told her that she needs to get this. ? ?Physical exam did not show any increased warmth or edema, no effusions noted, no joint line tenderness.  She has not had any fever or chills.  She has full active and passive range of motion with normal ambulation. ?Assessment: Patient presents with clinical mild osteoarthritis that does not seem to be affecting her on a day-to-day basis.  Her conservative management to this point has been minimal.  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  No concern at this time for traumatic cause of her pain or septic joint. ?Plan: I will send in Voltaren gel for the patient to apply over the knee up to 4 times daily as needed.  I have counseled her on exercising to increase lower extremity strength as well as shed any extra weight that can contribute to further stress on the knee joint.  We will follow-up regarding her knee pain at her general checkup in 3 months  and assess need for further management of her pain. ?

## 2021-09-22 NOTE — Progress Notes (Signed)
Internal Medicine Clinic Attending ? ?Case discussed with Dr. Dean  At the time of the visit.  We reviewed the resident?s history and exam and pertinent patient test results.  I agree with the assessment, diagnosis, and plan of care documented in the resident?s note.  ?

## 2021-09-29 ENCOUNTER — Ambulatory Visit (INDEPENDENT_AMBULATORY_CARE_PROVIDER_SITE_OTHER): Payer: Medicare Other | Admitting: Internal Medicine

## 2021-09-29 ENCOUNTER — Other Ambulatory Visit: Payer: Self-pay

## 2021-09-29 ENCOUNTER — Encounter: Payer: Self-pay | Admitting: Internal Medicine

## 2021-09-29 DIAGNOSIS — M1712 Unilateral primary osteoarthritis, left knee: Secondary | ICD-10-CM

## 2021-09-29 DIAGNOSIS — I10 Essential (primary) hypertension: Secondary | ICD-10-CM | POA: Diagnosis not present

## 2021-09-29 MED ORDER — ROSUVASTATIN CALCIUM 5 MG PO TABS: 5.0000 mg | ORAL_TABLET | Freq: Every day | ORAL | 2 refills | Status: DC

## 2021-09-29 MED ORDER — LISINOPRIL 5 MG PO TABS: 5.0000 mg | ORAL_TABLET | Freq: Every day | ORAL | 2 refills | Status: DC

## 2021-09-29 MED ORDER — EZETIMIBE 10 MG PO TABS: 10.0000 mg | ORAL_TABLET | Freq: Every day | ORAL | 2 refills | Status: DC

## 2021-09-29 NOTE — Assessment & Plan Note (Addendum)
Patient's blood pressure elevated [id number] systolic today.  Patient reports this is whitecoat hypertension and her pressures are well controlled with systolic [id number] at home.  She reports she has previously brought in a record of her at home blood pressure readings.  She has not had her blood pressure cuff compared to our cuffs here in clinic.  Currently she is only taking lisinopril 5 mg daily.  She previously self discontinued her hydrochlorothiazide due to its effects on potassium despite also being on potassium supplementation and reassurance.  BMP April 2023 unremarkable. ? ?On assessment, per our clinic readings blood pressures poorly controlled on current regimen.  Patient does report home blood pressures are normal.  Patient will need to bring in her home blood pressure cuff to compare with our blood pressure cuff here in clinic in order to verify this.  ?Plan: ?-Bring in blood pressure cuff to cross verify ?-Continue to record home blood pressures ?-Continue lisinopril 5 mg daily, could increase if needed ?-Given patient hesitant to restart hydrochlorothiazide due to hypokalemia, can consider amlodipine if additional antihypertensive needed. ?

## 2021-09-29 NOTE — Patient Instructions (Signed)
Thank you, [name] for allowing [place] to provide your care today. Today we discussed: ? ?Knee pain:  ?-I will order an x ray for you of the left knee. They will give you a call to set this up ? ?-For pain you can take up to: ?Tylenol 1,000mg  three times a day ?Ibuprofen 800mg  additional as needed   ? ?Also ice or heat, whichever feels better. And gentle exercise like walking/biking daily. ? ? ?My Chart Access: ?[email or web address] ? ? ?Please follow-up in 1 month for follow up. ? ?Please make sure to arrive 15 minutes prior to your next appointment. If you arrive late, you may be asked to reschedule.  ?  ?We look forward to seeing you next time. Please call our clinic at [phone] if you have any questions or concerns. The best time to call is Monday-Friday from 9am-4pm, but there is someone available 24/7. If after hours or the weekend, call the main hospital number and ask for the Internal Medicine Resident On-Call. If you need medication refills, please notify your pharmacy one week in advance and they will send [phone] a request. ?  ?Thank you for letting us take part in your care. Wishing you the best! ? ?[place], MD ?09/29/2021, 5:01 PM ?IM Resident, PGY-1 ? ?

## 2021-09-29 NOTE — Assessment & Plan Note (Addendum)
Patient reports persistent pain limiting her activities in the left knee.  No recent prior imaging of left knee on chart review.  Patient did have right knee x-ray which showed mild patellofemoral OA.  She reports Voltaren gel applied several times a day has had no effect.  She has been taking Tylenol arthritis once a day for her pain.  She is inquiring about a steroid shot for her knee.  She had one in her shoulder and has not had any further pain in her shoulder for several years.  She hopes that a steroid shot in her knee will have the same effect.  Discussed with patient that if we do eventually move on to do steroid shot she may have improvement in pain, it may not improve her pain, or she may have slight worsening of pain following shot administration.  Patient was counseled that corticosteroid shot is sometimes only a temporary solution. ? ?On assessment, suspect patient's chronic knee pain is secondary to osteoarthritis.  Will obtain left knee x-ray.  Discussed pain regimen with her. ?Plan: ?-Left knee x-ray ?-Patient can take Tylenol up to 1000 mg 3 times daily  ?-Can take additional ibuprofen 800 mg if needed ?-Ice or heat, elevation, gentle exercise ?-Follow-up in 1 month ?-Can consider corticosteroid injection left knee if conservative management ineffective ? ?

## 2021-09-29 NOTE — Progress Notes (Signed)
?  CC: chronic knee pain ? ?HPI: ? ?Caitlyn Ingram is a 66 y.o. female with a past medical history stated below and presents today for chronic knee pain. Please see problem based assessment and plan for additional details. ? ?Past Medical History:  ?Diagnosis Date  ? Abnormal urine finding 09/20/2013  ? Allergic urticaria 04/10/2015  ? Allergy   ? Cough 10/14/2016  ? Folliculitis 09/01/2017  ? Foot pain, right 11/22/2017  ? H/O bronchitis 08/19/2011  ? Managed with albuterol inhaler  ? H/O hiatal hernia   ? Health care maintenance 07/01/2013  ? Healthcare maintenance 07/01/2013  ? Hyperlipidemia   ? Hypertension   ? Hypokalemia 10/29/2012  ? Knee effusion, left 04/02/2018  ? Musculoskeletal pain 09/20/2013  ? Tendinopathy of right rotator cuff 10/02/2017  ? ? ?Current Outpatient Medications on File Prior to Visit  ?Medication Sig Dispense Refill  ? albuterol (PROAIR HFA) 108 (90 Base) MCG/ACT inhaler Inhale 1-2 puffs into the lungs every 6 (six) hours as needed for wheezing or shortness of breath. 18 g 1  ? diclofenac Sodium (VOLTAREN) 1 % GEL Apply 2 g topically 4 (four) times daily. 150 g 2  ? ibuprofen (ADVIL) 400 MG tablet TAKE 1/2 (ONE-HALF) TABLET BY MOUTH EVERY 8 HOURS AS NEEDED 90 tablet 0  ? ?No current facility-administered medications on file prior to visit.  ? ? ?Family History  ?Problem Relation Age of Onset  ? Cancer Neg Hx   ? Stroke Neg Hx   ? ? ?Review of Systems: ?ROS negative except for what is noted on the assessment and plan. ? ?Vitals:  ? 09/29/21 1559 09/29/21 1606  ?BP: (!) 175/79 (!) 186/77  ?Pulse: (!) 102   ?Temp: 98.8 ?F (37.1 ?C)   ?TempSrc: Oral   ?SpO2: 100%   ?Weight: 167 lb 8 oz (76 kg)   ?Height: 5\' 5"  (1.651 m)   ? ? ? ?Physical Exam: ?General: Well appearing African-American female, NAD ?HENT: normocephalic, atraumatic ?EYES: conjunctiva non-erythematous, no scleral icterus ?CV: regular rate, normal rhythm, no murmurs, rubs, gallops. ?Pulmonary: normal work of breathing on RA, lungs clear to  auscultation, no rales, wheezes, rhonchi ?Abdominal: non-distended, soft, non-tender to palpation, normal BS ?Skin: Warm and dry, no rashes or lesions ?MSK: Left knee appears slightly more swollen than right.  No erythema or calor noted.  Tenderness to palpation surrounding patella.  Crepitus noted.  No ligamentous laxity though patient experienced patellar pain with these movements.  Full knee extension and knee flexion.  Pain at the patella with McMurray test. ?Neurological: ?MS: awake, alert and oriented x3, normal speech and fund of knowledge ?Motor: moves all extremities antigravity ?Psych: normal affect ? ? ? ?Assessment & Plan:  ? ?See Encounters Tab for problem based charting. ? ?Patient discussed with Dr. ? ?Heide Spark, M.D. ?[facility] Internal Medicine, PGY-1 ?Pager: [phone] ?Date 09/29/2021 Time 8:20 PM ? ?

## 2021-09-30 NOTE — Progress Notes (Signed)
Internal Medicine Clinic Attending ? ?Case discussed with Dr. Zinoviev  At the time of the visit.  We reviewed the resident?s history and exam and pertinent patient test results.  I agree with the assessment, diagnosis, and plan of care documented in the resident?s note.  ?

## 2021-09-30 NOTE — Addendum Note (Signed)
Addended by: Aldine Contes on: 09/30/2021 06:21 PM ? ? Modules accepted: Level of Service ? ?

## 2021-10-06 ENCOUNTER — Ambulatory Visit (HOSPITAL_COMMUNITY)
Admission: RE | Admit: 2021-10-06 | Discharge: 2021-10-06 | Disposition: A | Discharge status: Home / Self Care | Payer: Medicare Other | Source: Ambulatory Visit | Attending: Internal Medicine | Admitting: Internal Medicine

## 2021-10-06 DIAGNOSIS — M25562 Pain in left knee: Secondary | ICD-10-CM | POA: Diagnosis not present

## 2021-10-06 DIAGNOSIS — M1712 Unilateral primary osteoarthritis, left knee: Secondary | ICD-10-CM | POA: Diagnosis not present

## 2021-10-18 ENCOUNTER — Other Ambulatory Visit: Payer: Self-pay | Admitting: Internal Medicine

## 2021-10-27 DIAGNOSIS — H25813 Combined forms of age-related cataract, bilateral: Secondary | ICD-10-CM | POA: Diagnosis not present

## 2021-11-03 ENCOUNTER — Encounter: Payer: Self-pay | Admitting: Student

## 2021-11-03 ENCOUNTER — Other Ambulatory Visit: Payer: Self-pay

## 2021-11-03 ENCOUNTER — Ambulatory Visit (INDEPENDENT_AMBULATORY_CARE_PROVIDER_SITE_OTHER): Payer: Medicare Other | Admitting: Student

## 2021-11-03 DIAGNOSIS — I10 Essential (primary) hypertension: Secondary | ICD-10-CM

## 2021-11-03 DIAGNOSIS — M170 Bilateral primary osteoarthritis of knee: Secondary | ICD-10-CM

## 2021-11-03 DIAGNOSIS — F17210 Nicotine dependence, cigarettes, uncomplicated: Secondary | ICD-10-CM

## 2021-11-03 MED ORDER — LISINOPRIL 10 MG PO TABS: 10.0000 mg | ORAL_TABLET | Freq: Every day | ORAL | 2 refills | Status: DC

## 2021-11-03 MED ORDER — LISINOPRIL 20 MG PO TABS: 20.0000 mg | ORAL_TABLET | Freq: Every day | ORAL | 11 refills | Status: DC

## 2021-11-03 NOTE — Assessment & Plan Note (Signed)
Today's Vitals   11/03/21 1552 11/03/21 1605  BP: (!) 186/80 (!) 176/60  Pulse: (!) 105 100  Temp: 98.5 F (36.9 C)   TempSrc: Oral   SpO2: 100%   Weight: 164 lb 4.8 oz (74.5 kg)   Height: 5\' 5"  (1.651 m)   PainSc: 8     Body mass index is 27.34 kg/m.  Patient with HTN, on lisinopril 5mg  daily. States that she has whitecoat hypertension and her BP is well controlled at home with systolics 120s-130s. She states that she is asymptomatic at this time. She was previously on HCTZ but this was discontinued due to hypokalemia.   Review of clinic BPs showing poorly controlled HTN on current regimen. She was supposed to bring a BP log to visit today per last OV note, but was unable to do so. She is willing to increase lisinopril dose to 10mg  daily but does not want to restart HCTZ at this time. She will need continued counseling on risks of uncontrolled HTN at subsequent visits. Last BMP April 2023 unremarkable.  Plan: -increase lisinopril to 10mg  daily -log home BPs -consider addition of norvasc if needed and if patient is amenable to this

## 2021-11-03 NOTE — Assessment & Plan Note (Signed)
Patient with complaints of right knee pain. She has a history of L knee OA for which she has tried conservative management with minimal relief and L knee x-ray last month showed mild-to-moderate OA. She states that her arthritis pain has now also moved over to her R knee and it is currently worse in her R knee. She has tried ibuprofen prn, tylenol arthritis prn, heating/ice, elevation, and gentle exercise with no relief. She does have a R knee x-ray which showed mild patellofemoral OA. She has tried voltaren gel without relief.  On my exam, R knee with mild swelling but no erythema or increased warmth noted. She does have tenderness with passive flexion and extension of R knee. Given exam and history, this appears to be OA of R knee.  She wishes to have a steroid injection in her R knee for pain relief. Discussed risks and benefits with patient and discussed that this may only be a temporary solution. Will continue conservative management and have her follow up tomorrow for steroid injection of R knee.  Plan: -conservative management as outlined above -steroid injection tomorrow

## 2021-11-03 NOTE — Progress Notes (Signed)
   CC: R knee OA  HPI:  Caitlyn Ingram is a 66 y.o. female with history listed below presenting to the [facility] for R knee osteoarthritis. Please see individualized problem based charting for full HPI.  Past Medical History:  Diagnosis Date   Abnormal urine finding 09/20/2013   Allergic urticaria 04/10/2015   Allergy    Cough 10/14/2016   Folliculitis 09/01/2017   Foot pain, right 11/22/2017   H/O bronchitis 08/19/2011   Managed with albuterol inhaler   H/O hiatal hernia    Health care maintenance 07/01/2013   Healthcare maintenance 07/01/2013   Hyperlipidemia    Hypertension    Hypokalemia 10/29/2012   Knee effusion, left 04/02/2018   Musculoskeletal pain 09/20/2013   Tendinopathy of right rotator cuff 10/02/2017    Review of Systems:  Negative aside from that listed in individualized problem based charting.  Physical Exam:  Vitals:   11/03/21 1552 11/03/21 1605  BP: (!) 186/80 (!) 176/60  Pulse: (!) 105 100  Temp: 98.5 F (36.9 C)   TempSrc: Oral   SpO2: 100%   Weight: 164 lb 4.8 oz (74.5 kg)   Height: 5\' 5"  (1.651 m)    Physical Exam Constitutional:      Appearance: Normal appearance. She is not ill-appearing.  HENT:     Nose: Nose normal. No congestion.     Mouth/Throat:     Mouth: Mucous membranes are moist.     Pharynx: Oropharynx is clear. No oropharyngeal exudate.  Eyes:     Extraocular Movements: Extraocular movements intact.     Conjunctiva/sclera: Conjunctivae normal.     Pupils: Pupils are equal, round, and reactive to light.  Cardiovascular:     Rate and Rhythm: Normal rate and regular rhythm.     Pulses: Normal pulses.     Heart sounds: Normal heart sounds. No murmur heard.   No gallop.  Pulmonary:     Effort: Pulmonary effort is normal.     Breath sounds: Normal breath sounds. No wheezing or rales.  Abdominal:     General: Bowel sounds are normal. There is no distension.     Palpations: Abdomen is soft.     Tenderness: There is no abdominal tenderness.   Musculoskeletal:     Comments: Mild swelling in R knee, tenderness with passive flexion and extension. No increased warmth or erythema noted in R knee. No crepitus palpated.  Skin:    General: Skin is warm and dry.  Neurological:     General: No focal deficit present.     Mental Status: She is alert and oriented to person, place, and time.  Psychiatric:        Mood and Affect: Mood normal.        Behavior: Behavior normal.     Assessment & Plan:   See Encounters Tab for problem based charting.  Patient discussed with Dr.  

## 2021-11-03 NOTE — Patient Instructions (Signed)
Ms. Melling,  It was a pleasure seeing you in the clinic today.   We will perform the knee injection for your right knee tomorrow at 3:15PM. I have increased your blood pressure medicine (lisinopril). Please pick up your new prescription from your pharmacy.  Please call our clinic at [phone] if you have any questions or concerns. The best time to call is Monday-Friday from 9am-4pm, but there is someone available 24/7 at the same number. If you need medication refills, please notify your pharmacy one week in advance and they will send [place] a request.   Thank you for letting us take part in your care. We look forward to seeing you next time!

## 2021-11-04 ENCOUNTER — Ambulatory Visit (INDEPENDENT_AMBULATORY_CARE_PROVIDER_SITE_OTHER): Payer: Medicare Other | Admitting: Student

## 2021-11-04 ENCOUNTER — Other Ambulatory Visit: Payer: Self-pay

## 2021-11-04 ENCOUNTER — Encounter: Payer: Self-pay | Admitting: Student

## 2021-11-04 DIAGNOSIS — M170 Bilateral primary osteoarthritis of knee: Secondary | ICD-10-CM

## 2021-11-04 DIAGNOSIS — M1711 Unilateral primary osteoarthritis, right knee: Secondary | ICD-10-CM

## 2021-11-04 NOTE — Progress Notes (Addendum)
CC: Right knee osteoarthritis  HPI:  [name] is a 66 y.o. female with history listed below presenting to the [facility] for right knee osteoarthritis. Please see individualized problem based charting for full HPI.  Past Medical History:  Diagnosis Date   Abnormal urine finding 09/20/2013   Allergic urticaria 04/10/2015   Allergy    Cough 10/14/2016   Folliculitis 09/01/2017   Foot pain, right 11/22/2017   H/O bronchitis 08/19/2011   Managed with albuterol inhaler   H/O hiatal hernia    Health care maintenance 07/01/2013   Healthcare maintenance 07/01/2013   Hyperlipidemia    Hypertension    Hypokalemia 10/29/2012   Knee effusion, left 04/02/2018   Musculoskeletal pain 09/20/2013   Tendinopathy of right rotator cuff 10/02/2017    Review of Systems:  Negative aside from that listed in individualized problem based charting.  Physical Exam:  Vitals:   11/04/21 1534  BP: (!) 164/84  Pulse: 99  Temp: 98.5 F (36.9 C)  SpO2: 100%  Weight: 164 lb 11.2 oz (74.7 kg)  Height: 5\' 4"  (1.626 m)   Physical Exam Constitutional:      Appearance: Normal appearance. She is not ill-appearing.  HENT:     Nose: Nose normal.     Mouth/Throat:     Mouth: Mucous membranes are moist.     Pharynx: Oropharynx is clear. No oropharyngeal exudate.  Eyes:     Extraocular Movements: Extraocular movements intact.     Conjunctiva/sclera: Conjunctivae normal.     Pupils: Pupils are equal, round, and reactive to light.  Cardiovascular:     Rate and Rhythm: Normal rate and regular rhythm.     Pulses: Normal pulses.     Heart sounds: Normal heart sounds. No murmur heard.    No gallop.  Pulmonary:     Effort: Pulmonary effort is normal.     Breath sounds: Normal breath sounds. No wheezing or rales.  Abdominal:     General: Bowel sounds are normal. There is no distension.     Palpations: Abdomen is soft.     Tenderness: There is no abdominal tenderness.  Musculoskeletal:     Comments: Mild swelling in  R knee and mild tenderness with passive and active flexion of knee. Minimal crepitus noted. No erythema or increased warmth.  Skin:    General: Skin is warm and dry.  Neurological:     General: No focal deficit present.     Mental Status: She is alert and oriented to person, place, and time.  Psychiatric:        Mood and Affect: Mood normal.        Behavior: Behavior normal.      Assessment & Plan:   See Encounters Tab for problem based charting.  Patient seen with Dr.     Procedure: steroid knee injection of right knee Indication: right knee OA Performing provider: Sol Blazing, MD Supervising Physician: Dr. Merrilyn Puma Consent obtained.  Procedure: The anterolateral edge of the patella was marked to identify insertion point. The area was prepped in the usual sterile manner. Then a 21g needle was inserted into the marked insertion point and advanced to a depth of ~3cm. Attempt for aspiration was performed without bloody return. Then a combination of 3cc's of 1% lidocaine and 1cc of 40mg /cc of kenalog was injected without resistance or patient discomfort. The needle was then removed and a bandaid applied over the insertion site.   Follow-up: Patient tolerated the procedure well without  complications. Standard post-procedure care and return precautions were provided.

## 2021-11-04 NOTE — Assessment & Plan Note (Signed)
Right knee steroid injection administered today without complications. Please see note from 11/03/2021 for full details. Physical exam prior to steroid injection was essentially unchanged from yesterday.

## 2021-11-04 NOTE — Patient Instructions (Signed)
Caitlyn Ingram,  It was a pleasure seeing you in the clinic today.   You received a steroid injection in your right knee today. I hope that this helps with your pain. Please come back in 1 month for blood pressure follow up.  Please call our clinic at [phone] if you have any questions or concerns. The best time to call is Monday-Friday from 9am-4pm, but there is someone available 24/7 at the same number. If you need medication refills, please notify your pharmacy one week in advance and they will send [place] a request.   Thank you for letting us take part in your care. We look forward to seeing you next time!

## 2021-11-05 NOTE — Progress Notes (Signed)
Internal Medicine Clinic Attending  I saw and evaluated the patient.  I personally confirmed the key portions of the history and exam documented by Dr. Jinwala and I reviewed pertinent patient test results.  The assessment, diagnosis, and plan were formulated together and I agree with the documentation in the resident's note.  

## 2021-11-10 NOTE — Progress Notes (Signed)
Internal Medicine Clinic Attending  Case discussed with Dr. Jinwala  At the time of the visit.  We reviewed the resident's history and exam and pertinent patient test results.  I agree with the assessment, diagnosis, and plan of care documented in the resident's note.  

## 2021-12-13 ENCOUNTER — Ambulatory Visit: Payer: Medicare Other

## 2021-12-13 ENCOUNTER — Ambulatory Visit (INDEPENDENT_AMBULATORY_CARE_PROVIDER_SITE_OTHER): Payer: Medicare Other | Admitting: Student

## 2021-12-13 DIAGNOSIS — E785 Hyperlipidemia, unspecified: Secondary | ICD-10-CM | POA: Diagnosis not present

## 2021-12-13 DIAGNOSIS — M170 Bilateral primary osteoarthritis of knee: Secondary | ICD-10-CM | POA: Diagnosis not present

## 2021-12-13 DIAGNOSIS — I10 Essential (primary) hypertension: Secondary | ICD-10-CM

## 2021-12-13 DIAGNOSIS — F17210 Nicotine dependence, cigarettes, uncomplicated: Secondary | ICD-10-CM | POA: Diagnosis not present

## 2021-12-13 DIAGNOSIS — Z720 Tobacco use: Secondary | ICD-10-CM

## 2021-12-13 NOTE — Patient Instructions (Signed)
Thank you, Ms.Christell E Farace for allowing [place] to provide your care today. Today we discussed   Blood pressure cuff Please bring your blood pressure cuff next week and continue taking your lisinopril.    Smoking Please stop smoking and let us know how we can help  Parking sticker I will work on the paperwork.   I have ordered the following labs for you:  Lab Orders  No laboratory test(s) ordered today     Referrals ordered today:   Referral Orders  No referral(s) requested today     I have ordered the following medication/changed the following medications:   Stop the following medications: There are no discontinued medications.   Start the following medications: No orders of the defined types were placed in this encounter.    Follow up:  1 week blood pressure and knee injection     Should you have any questions or concerns please call the internal medicine clinic at [phone].    Thalia Bloodgood, D.O. [facility]

## 2021-12-14 NOTE — Assessment & Plan Note (Signed)
Discussed importance of taking rosuvastatin as LDL and total cholesterol levels quite elevated. Plan to repeat at follow up.

## 2021-12-14 NOTE — Assessment & Plan Note (Signed)
Presents for steroid injection of the left knee. Had procedure done on right knee and has had significant improvement in functional status. With blood pressure of 186/75 and 208/76, discussed needing to wait on injection until improvement in BP. Patient in agreement. She is also requesting a parking pass which our nursing staff will assist with.

## 2021-12-14 NOTE — Assessment & Plan Note (Signed)
Continues to struggle with tobacco use disorder. Discussed importance of quitting with uncontrolled HTN and HLD putting her at risk for MI and CVA. She will quit without assistance of medications.

## 2021-12-14 NOTE — Assessment & Plan Note (Signed)
Continues to have uncontrolled hypertension. She has had side effects with multiple medications and has not been able to tolerate diuretics or CCB. Current regimen of lisinopril 10 mg daily. She states at home her blood pressures are well controlled, encouraged her to return to our clinic with her blood pressure cuff.   Follow up in 1-2 weeks

## 2021-12-14 NOTE — Progress Notes (Signed)
CC: left knee pain, hypertension  HPI:  [name] is a 66 y.o. female living with a history stated below and presents today for left knee pain. Please see problem based assessment and plan for additional details.  Past Medical History:  Diagnosis Date   Abnormal urine finding 09/20/2013   Allergic urticaria 04/10/2015   Allergy    Cough 10/14/2016   Folliculitis 09/01/2017   Foot pain, right 11/22/2017   H/O bronchitis 08/19/2011   Managed with albuterol inhaler   H/O hiatal hernia    Health care maintenance 07/01/2013   Healthcare maintenance 07/01/2013   Hyperlipidemia    Hypertension    Hypokalemia 10/29/2012   Knee effusion, left 04/02/2018   Musculoskeletal pain 09/20/2013   Tendinopathy of right rotator cuff 10/02/2017    Current Outpatient Medications on File Prior to Visit  Medication Sig Dispense Refill   albuterol (PROAIR HFA) 108 (90 Base) MCG/ACT inhaler Inhale 1-2 puffs into the lungs every 6 (six) hours as needed for wheezing or shortness of breath. 18 g 1   diclofenac Sodium (VOLTAREN) 1 % GEL Apply 2 g topically 4 (four) times daily. 150 g 2   ezetimibe (ZETIA) 10 MG tablet Take 1 tablet (10 mg total) by mouth daily. 90 tablet 2   ibuprofen (ADVIL) 400 MG tablet TAKE 1/2 (ONE-HALF) TABLET BY MOUTH EVERY 8 HOURS AS NEEDED 90 tablet 0   lisinopril (ZESTRIL) 10 MG tablet Take 1 tablet (10 mg total) by mouth daily. 30 tablet 2   rosuvastatin (CRESTOR) 5 MG tablet Take 1 tablet (5 mg total) by mouth daily. 90 tablet 2   No current facility-administered medications on file prior to visit.    Family History  Problem Relation Age of Onset   Cancer Neg Hx    Stroke Neg Hx     Social History   Socioeconomic History   Marital status: Legally Separated    Spouse name: Not on file   Number of children: Not on file   Years of education: Not on file   Highest education level: Not on file  Occupational History   Not on file  Tobacco Use   Smoking status: Every Day     Packs/day: 0.10    Types: Cigarettes   Smokeless tobacco: Never   Tobacco comments:    Cutting back. 2-3 per day  Substance and Sexual Activity   Alcohol use: Yes    Alcohol/week: 0.0 standard drinks of alcohol    Comment: Rarely.   Drug use: No   Sexual activity: Not on file  Other Topics Concern   Not on file  Social History Narrative   Not on file   Social Determinants of Health   Financial Resource Strain: Not on file  Food Insecurity: Not on file  Transportation Needs: Not on file  Physical Activity: Not on file  Stress: Not on file  Social Connections: Not on file  Intimate Partner Violence: Not on file    Review of Systems: ROS negative except for what is noted on the assessment and plan.  Vitals:   12/13/21 1524 12/13/21 1536  BP: (!) 208/76 (!) 186/75  Pulse: 99 (!) 105  Weight: 158 lb 1.6 oz (71.7 kg)     Physical Exam: Constitutional: in no acute distress HENT: normocephalic atraumatic, mucous membranes moist Eyes: conjunctiva non-erythematous Neck: supple Cardiovascular: regular rate and rhythm, no m/r/g Pulmonary/Chest: normal work of breathing on room air MSK: normal bulk and tone Neurological: alert & oriented  x 3 Skin: warm and dry Psych: normal mood  Assessment & Plan:   Osteoarthritis of knees, bilateral Presents for steroid injection of the left knee. Had procedure done on right knee and has had significant improvement in functional status. With blood pressure of 186/75 and 208/76, discussed needing to wait on injection until improvement in BP. Patient in agreement. She is also requesting a parking pass which our nursing staff will assist with.   Hyperlipidemia Discussed importance of taking rosuvastatin as LDL and total cholesterol levels quite elevated. Plan to repeat at follow up.   Tobacco abuse Continues to struggle with tobacco use disorder. Discussed importance of quitting with uncontrolled HTN and HLD putting her at risk for MI  and CVA. She will quit without assistance of medications.   Essential hypertension Continues to have uncontrolled hypertension. She has had side effects with multiple medications and has not been able to tolerate diuretics or CCB. Current regimen of lisinopril 10 mg daily. She states at home her blood pressures are well controlled, encouraged her to return to our clinic with her blood pressure cuff.   Follow up in 1-2 weeks  Patient discussed with Dr. Lorriane Shire, D.O. [facility] Internal Medicine, PGY-3 Phone: [phone] Date 12/14/2021 Time 7:49 PM

## 2021-12-20 NOTE — Progress Notes (Signed)
Internal Medicine Clinic Attending  Case discussed with Dr. Katsadouros  At the time of the visit.  We reviewed the resident's history and exam and pertinent patient test results.  I agree with the assessment, diagnosis, and plan of care documented in the resident's note.  

## 2021-12-22 ENCOUNTER — Other Ambulatory Visit: Payer: Self-pay | Admitting: Internal Medicine

## 2022-01-05 ENCOUNTER — Ambulatory Visit: Payer: Self-pay

## 2022-01-05 NOTE — Patient Outreach (Signed)
  Care Coordination   Initial Visit Note   01/05/2022 Name: Caitlyn Ingram MRN: [id number] DOB: [birth year]  Caitlyn Ingram is a 66 y.o. year old female who sees Champ Mungo, DO for primary care. I spoke with  Mckinley Jewel by phone today  What matters to the patients health and wellness today?  Patient concerned about her blood pressure pills being too strong and need to be adjusted.  She states her blood pressure is fine when she takes it at home. Patient to make appointment and follow up at clinic.   Goals Addressed   None     SDOH assessments and interventions completed:  Yes  SDOH Interventions Today    Flowsheet Row Most Recent Value  SDOH Interventions   Housing Interventions Intervention Not Indicated  Transportation Interventions Intervention Not Indicated        Care Coordination Interventions Activated:  No  Care Coordination Interventions:  No, not indicated   Follow up plan: No further intervention required.   Encounter Outcome:  Pt. Visit Completed

## 2022-01-17 ENCOUNTER — Encounter: Payer: Self-pay | Admitting: Internal Medicine

## 2022-01-17 ENCOUNTER — Ambulatory Visit (INDEPENDENT_AMBULATORY_CARE_PROVIDER_SITE_OTHER): Payer: Medicare Other

## 2022-01-17 ENCOUNTER — Ambulatory Visit (INDEPENDENT_AMBULATORY_CARE_PROVIDER_SITE_OTHER): Payer: Medicare Other | Admitting: Internal Medicine

## 2022-01-17 VITALS — BP 161/60 | HR 104 | Temp 98.2°F | Ht 64.0 in | Wt 155.9 lb

## 2022-01-17 VITALS — BP 157/84 | HR 105 | Temp 98.2°F | Ht 64.0 in | Wt 155.0 lb

## 2022-01-17 DIAGNOSIS — E785 Hyperlipidemia, unspecified: Secondary | ICD-10-CM | POA: Diagnosis not present

## 2022-01-17 DIAGNOSIS — I10 Essential (primary) hypertension: Secondary | ICD-10-CM

## 2022-01-17 DIAGNOSIS — Z0000 Encounter for general adult medical examination without abnormal findings: Secondary | ICD-10-CM

## 2022-01-17 DIAGNOSIS — M170 Bilateral primary osteoarthritis of knee: Secondary | ICD-10-CM | POA: Diagnosis not present

## 2022-01-17 MED ORDER — EZETIMIBE 10 MG PO TABS: 10.0000 mg | ORAL_TABLET | Freq: Every day | ORAL | 2 refills | Status: DC

## 2022-01-17 MED ORDER — OLMESARTAN MEDOXOMIL 20 MG PO TABS: 20.0000 mg | ORAL_TABLET | Freq: Every day | ORAL | 2 refills | Status: DC

## 2022-01-17 NOTE — Assessment & Plan Note (Addendum)
BP uncontrolled today at 157/84, on recheck is 161/60. Current prescribed regimen is lisinopril 10 mg daily however she states that she has been taking old HCTZ that she had leftover because she felt dizzy and off balance with lisinopril. She has a history of several anti-hypertensive medication intolerances due to side effects. She states that at home her BP falls within normal range, typically around SBP 120s. She has been asked to bring in her BP cuff several times however I do not believe this has happened and she does not have it today. She has not previously been on ARB. She has had problems with hypokalemia on previous HCTZ therapy. Plan:Patient counseled on not changing therapies without first discussing with our team. I have sent in olmesartan 20 mg daily as this is a medication class that she has not yet tried and is in agreement to trying.

## 2022-01-17 NOTE — Progress Notes (Signed)
CC: BP check, L knee injection  HPI:  [name] is a 66 y.o. person with past medical history as detailed below who presents today for BP check and L knee injection. Please see problem based charting for detailed assessment and plan.  Past Medical History:  Diagnosis Date   Abnormal urine finding 09/20/2013   Allergic urticaria 04/10/2015   Allergy    Cough 10/14/2016   Folliculitis 09/01/2017   Foot pain, right 11/22/2017   H/O bronchitis 08/19/2011   Managed with albuterol inhaler   H/O hiatal hernia    Health care maintenance 07/01/2013   Healthcare maintenance 07/01/2013   Hyperlipidemia    Hypertension    Hypokalemia 10/29/2012   Knee effusion, left 04/02/2018   Musculoskeletal pain 09/20/2013   Tendinopathy of right rotator cuff 10/02/2017   Review of Systems:  Negative unless otherwise stated.  Physical Exam:  Vitals:   01/17/22 1619 01/17/22 1655  BP: (!) 157/84 (!) 161/60  Pulse: (!) 105 (!) 104  Temp: 98.2 F (36.8 C)   TempSrc: Oral   SpO2: 100%   Weight: 155 lb 14.4 oz (70.7 kg)   Height: 5\' 4"  (1.626 m)    Constitutional:Appears stated age, resting comfortably. In no acute distress. Cardio:Regular rate and rhythm. No murmurs, rubs, or gallops. Pulm:Clear to auscultation bilaterally. Normal work of breathing on room air. for extremity edema.L knee without increased warmth or tenderness, no increased edema appreciated. Ambulates with limp on L. Skin:Warm and dry. Neuro:Alert and oriented x3. No focal deficit noted. Psych:Pleasant mood and affect.   Assessment & Plan:   See Encounters Tab for problem based charting.  Essential hypertension BP uncontrolled today at 157/84, on recheck is 161/60. Current prescribed regimen is lisinopril 10 mg daily however she states that she has been taking old HCTZ that she had leftover because she felt dizzy and off balance with lisinopril. She has a history of several anti-hypertensive medication  intolerances due to side effects. She states that at home her BP falls within normal range, typically around SBP 120s. She has been asked to bring in her BP cuff several times however I do not believe this has happened and she does not have it today. She has not previously been on ARB. She has had problems with hypokalemia on previous HCTZ therapy. Plan:Patient counseled on not changing therapies without first discussing with our team. I have sent in olmesartan 20 mg daily as this is a medication class that she has not yet tried and is in agreement to trying.  Hyperlipidemia Current regimen is Zetia 10 mg daily, rosuvastatin 5 mg daily. She denies taking Zetia however due to concerns about the dose being quite high. I have counseled her on this dose being a low dose for this medication and reassured her that it is not too high to start out with. Plan:Continue rosuvastatin 5 mg daily. Patient is in agreement to start Zetia 10 mg daily. Repeat lipid panel in 3 months.  Osteoarthritis of knees, bilateral Previously underwent steroid injection of R knee for osteoarthritis-related pain with significant improvement of pain and functional status. She is requesting an injection for her L knee today.  Procedure: Steroid knee injection of left knee Indication: Left knee OA Performing provider: [id number], DO Supervising Physician: Dr. Champ Mungo Consent obtained.   Procedure: The anterolateral edge of the patella was marked to identify insertion point. The area was prepped in the usual sterile manner. Then a 21g needle was inserted into the  marked insertion point and advanced to a depth of ~3cm. Then a combination of 3cc's of 1% lidocaine and 1cc of 40mg /cc of kenalog was injected without resistance or patient discomfort. The needle was then removed and a bandaid applied over the insertion site.    Follow-up: Patient tolerated the procedure well without complications. Standard post-procedure care and return  precautions were provided.  Patient discussed with Dr. 

## 2022-01-17 NOTE — Assessment & Plan Note (Addendum)
Current regimen is Zetia 10 mg daily, rosuvastatin 5 mg daily. She denies taking Zetia however due to concerns about the dose being quite high. I have counseled her on this dose being a low dose for this medication and reassured her that it is not too high to start out with. Plan:Continue rosuvastatin 5 mg daily. Patient is in agreement to start Zetia 10 mg daily. Repeat lipid panel in 3 months.

## 2022-01-17 NOTE — Assessment & Plan Note (Addendum)
Previously underwent steroid injection of R knee for osteoarthritis-related pain with significant improvement of pain and functional status. She is requesting an injection for her L knee today.  Procedure: Steroid knee injection of left knee Indication: Left knee OA Performing provider: Champ Mungo, DO Supervising Physician: Dr. Mart Piggs Consent obtained.  Procedure: The anterolateral edge of the patella was marked to identify insertion point. The area was prepped in the usual sterile manner. Then a 21g needle was inserted into the marked insertion point and advanced to a depth of ~3cm. Then a combination of 3cc's of 1% lidocaine and 1cc of 40mg /cc of kenalog was injected without resistance or patient discomfort. The needle was then removed and a bandaid applied over the insertion site.   Follow-up: Patient tolerated the procedure well without complications. Standard post-procedure care and return precautions were provided.

## 2022-01-18 NOTE — Progress Notes (Unsigned)
Subjective:   Caitlyn Ingram is a 66 y.o. female who presents for an Initial Medicare Annual Wellness Visit. I connected with  Mckinley Jewel on 01/18/22 by a  Face-To-Face  enabled telemedicine application and verified that I am speaking with the correct person using two identifiers.  Patient Location: Other:  Office/Clinic  Provider Location: Office/Clinic  I discussed the limitations of evaluation and management by telemedicine. The patient expressed understanding and agreed to proceed.  Review of Systems    Defer to PCP       Objective:    Today's Vitals   01/17/22 1600 01/18/22 0920  BP: (!) 157/84   Pulse: (!) 105   Temp: 98.2 F (36.8 C)   TempSrc: Oral   SpO2: 100%   Weight: 155 lb (70.3 kg)   Height: 5\' 4"  (1.626 m)   PainSc:  8    Body mass index is 26.61 kg/m.     01/18/2022    9:25 AM 11/03/2021    4:02 PM 09/29/2021    3:58 PM 09/21/2021    3:06 PM 08/30/2021    4:08 PM 07/26/2021    2:38 PM 07/13/2021    3:21 PM  Advanced Directives  Does Patient Have a Medical Advance Directive? No No No No No No No  Would patient like information on creating a medical advance directive? No - Patient declined No - Patient declined No - Patient declined No - Patient declined No - Patient declined No - Patient declined No - Patient declined    Current Medications (verified) Outpatient Encounter Medications as of 01/17/2022  Medication Sig   albuterol (PROAIR HFA) 108 (90 Base) MCG/ACT inhaler Inhale 1-2 puffs into the lungs every 6 (six) hours as needed for wheezing or shortness of breath.   diclofenac Sodium (VOLTAREN) 1 % GEL Apply 2 g topically 4 (four) times daily.   ezetimibe (ZETIA) 10 MG tablet Take 1 tablet (10 mg total) by mouth daily.   ibuprofen (ADVIL) 400 MG tablet TAKE 1/2 (ONE-HALF) TABLET BY MOUTH EVERY 8 HOURS AS NEEDED   olmesartan (BENICAR) 20 MG tablet Take 1 tablet (20 mg total) by mouth daily.   rosuvastatin (CRESTOR) 5 MG tablet Take 1 tablet (5 mg  total) by mouth daily.   No facility-administered encounter medications on file as of 01/17/2022.    Allergies (verified) Flagyl [metronidazole hcl], Benadryl [diphenhydramine hcl], Hydrocodone-acetaminophen, Iodine, and Lisinopril   History: Past Medical History:  Diagnosis Date   Abnormal urine finding 09/20/2013   Allergic urticaria 04/10/2015   Allergy    Cough 10/14/2016   Folliculitis 09/01/2017   Foot pain, right 11/22/2017   H/O bronchitis 08/19/2011   Managed with albuterol inhaler   H/O hiatal hernia    Health care maintenance 07/01/2013   Healthcare maintenance 07/01/2013   Hyperlipidemia    Hypertension    Hypokalemia 10/29/2012   Knee effusion, left 04/02/2018   Musculoskeletal pain 09/20/2013   Tendinopathy of right rotator cuff 10/02/2017   Past Surgical History:  Procedure Laterality Date   DILATION AND CURETTAGE OF UTERUS     EUS N/A 10/30/2013   Procedure: LOWER ENDOSCOPIC ULTRASOUND (EUS);  Surgeon: 12/30/2013, MD;  Location: [facility];  Service: Endoscopy;  Laterality: N/A;   TUBAL LIGATION     Family History  Problem Relation Age of Onset   Cancer Neg Hx    Stroke Neg Hx    Social History   Socioeconomic History   Marital status: Legally Separated  Spouse name: Not on file   Number of children: Not on file   Years of education: Not on file   Highest education level: Not on file  Occupational History   Not on file  Tobacco Use   Smoking status: Every Day    Packs/day: 0.10    Types: Cigarettes   Smokeless tobacco: Never   Tobacco comments:    Cutting back. 2-3 per day  Substance and Sexual Activity   Alcohol use: Yes    Alcohol/week: 0.0 standard drinks of alcohol    Comment: Rarely.   Drug use: No   Sexual activity: Not on file  Other Topics Concern   Not on file  Social History Narrative   Not on file   Social Determinants of Health   Financial Resource Strain: Low Risk  (01/18/2022)   Overall Financial Resource Strain (CARDIA)     Difficulty of Paying Living Expenses: Not hard at all  Food Insecurity: No Food Insecurity (01/18/2022)   Hunger Vital Sign    Worried About Running Out of Food in the Last Year: Never true    Ran Out of Food in the Last Year: Never true  Transportation Needs: No Transportation Needs (01/18/2022)   PRAPARE - Administrator, Civil Service (Medical): No    Lack of Transportation (Non-Medical): No  Physical Activity: Unknown (01/18/2022)   Exercise Vital Sign    Days of Exercise per Week: 7 days    Minutes of Exercise per Session: Not on file  Stress: No Stress Concern Present (01/18/2022)   [employer] of Occupational Health - Occupational Stress Questionnaire    Feeling of Stress : Not at all  Social Connections: Moderately Integrated (01/18/2022)   Social Connection and Isolation Panel [NHANES]    Frequency of Communication with Friends and Family: More than three times a week    Frequency of Social Gatherings with Friends and Family: More than three times a week    Attends Religious Services: More than 4 times per year    Active Member of [employer] or Organizations: Yes    Attends Banker Meetings: More than 4 times per year    Marital Status: Widowed    Tobacco Counseling Ready to quit: Not Answered Counseling given: Not Answered Tobacco comments: Cutting back. 2-3 per day   Clinical Intake:  Pre-visit preparation completed: Yes  Pain : 0-10 Pain Score: 8  Pain Type: Chronic pain Pain Location: Knee Pain Orientation: Left Pain Descriptors / Indicators: Constant, Aching, Throbbing Pain Onset: More than a month ago Pain Frequency: Constant     Nutritional Risks: None Diabetes: No  How often do you need to have someone help you when you read instructions, pamphlets, or other written materials from your doctor or pharmacy?: 1 - Never What is the last grade level you completed in school?: 14 years  Diabetic?No  Interpreter Needed?:  No  Information entered by :: Mel Almond Valeda Corzine,cma 01/18/22 9:21am   Activities of Daily Living    01/18/2022    9:25 AM 12/13/2021    3:29 PM  In your present state of health, do you have any difficulty performing the following activities:  Hearing? 0 0  Vision? 0 0  Difficulty concentrating or making decisions? 0 0  Walking or climbing stairs? 1 1  Dressing or bathing? 0 0  Doing errands, shopping? 0 0    Patient Care Team: Champ Mungo, DO as PCP - General (Internal Medicine)  Indicate any recent Medical  Services you may have received from other than [facility] providers in the past year (date may be approximate).     Assessment:   This is a routine wellness examination for [place].  Hearing/Vision screen No results found.  Dietary issues and exercise activities discussed:     Goals Addressed   None   Depression Screen    01/18/2022    9:23 AM 12/13/2021    4:06 PM 11/03/2021    4:00 PM 09/29/2021    3:58 PM 08/30/2021    4:11 PM 07/26/2021    2:37 PM 07/13/2021    3:21 PM  PHQ 2/9 Scores  PHQ - 2 Score 0 0 0 0 0 0 0  PHQ- 9 Score      0 0    Fall Risk    01/18/2022    9:25 AM 12/13/2021    3:29 PM 11/03/2021    3:59 PM 09/29/2021    3:58 PM 09/21/2021    3:05 PM  [place] in the past year? 1 1 1 1 1   Number falls in past yr: 0 0 0 0 0  Injury with Fall? 0 0 0 0 0  Risk for fall due to : No Fall Risks No Fall Risks Other (Comment) Impaired balance/gait Impaired balance/gait  Follow up Falls prevention discussed;Falls evaluation completed Falls evaluation completed Falls evaluation completed;Falls prevention discussed Falls evaluation completed Falls evaluation completed;Falls prevention discussed    FALL RISK PREVENTION PERTAINING TO THE HOME:  Any stairs in or around the home? No  If so, are there any without handrails? No  Home free of loose throw rugs in walkways, pet beds, electrical cords, etc? Yes  Adequate lighting in your home to reduce risk of falls? Yes    ASSISTIVE DEVICES UTILIZED TO PREVENT FALLS:  Life alert? No  Use of a cane, walker or w/c? No  Grab bars in the bathroom? No  Shower chair or bench in shower? No  Elevated toilet seat or a handicapped toilet? No   TIMED UP AND GO:  Was the test performed? No .  Length of time to ambulate 10 feet: N/A sec.   Gait slow and steady without use of assistive device  Cognitive Function:        01/18/2022    9:26 AM  6CIT Screen  What Year? 0 points  What month? 0 points  What time? 0 points  Count back from 20 0 points  Months in reverse 0 points  Repeat phrase 0 points  Total Score 0 points    Immunizations Immunization History  Administered Date(s) Administered   Td 09/20/2013    TDAP status: Up to date  Flu Vaccine status: Due, Education has been provided regarding the importance of this vaccine. Advised may receive this vaccine at local pharmacy or Health Dept. Aware to provide a copy of the vaccination record if obtained from local pharmacy or Health Dept. Verbalized acceptance and understanding.  Pneumococcal vaccine status: Up to date  Covid-19 vaccine status: Information provided on how to obtain vaccines.   Qualifies for Shingles Vaccine? No   Zostavax completed No   Shingrix Completed?: No.    Education has been provided regarding the importance of this vaccine. Patient has been advised to call insurance company to determine out of pocket expense if they have not yet received this vaccine. Advised may also receive vaccine at local pharmacy or Health Dept. Verbalized acceptance and understanding.  Screening Tests Health Maintenance  Topic  Date Due   COVID-19 Vaccine (1) Never done   DEXA SCAN  Never done   INFLUENZA VACCINE  12/28/2021   Pneumonia Vaccine 91+ Years old (1 - PCV) 06/29/2022 (Originally 01/31/1962)   Zoster Vaccines- Shingrix (1 of 2) 07/29/2022 (Originally 01/31/2005)   MAMMOGRAM  09/07/2023   TETANUS/TDAP  09/21/2023   COLONOSCOPY (Pts  45-16yrs Insurance coverage will need to be confirmed)  10/02/2023   Hepatitis C Screening  Completed   HPV VACCINES  Aged Out    Health Maintenance  Health Maintenance Due  Topic Date Due   COVID-19 Vaccine (1) Never done   DEXA SCAN  Never done   INFLUENZA VACCINE  12/28/2021    Colorectal cancer screening: Type of screening: Colonoscopy. Completed 10/01/2013. Repeat every 10 years  Mammogram status: Completed 09/06/2021. Repeat every year   Lung Cancer Screening: (Low Dose CT Chest recommended if Age 13-80 years, 30 pack-year currently smoking OR have quit w/in 15years.) does not qualify.   Lung Cancer Screening Referral: N/A  Additional Screening:  Hepatitis C Screening: does not qualify; Completed 11/26/2019  Vision Screening: Recommended annual ophthalmology exams for early detection of glaucoma and other disorders of the eye. Is the patient up to date with their annual eye exam?  Yes  Who is the provider or what is the name of the office in which the patient attends annual eye exams? NA If pt is not established with a provider, would they like to be referred to a provider to establish care? No .   Dental Screening: Recommended annual dental exams for proper oral hygiene  Community Resource Referral / Chronic Care Management: CRR required this visit?  No   CCM required this visit?  No      Plan:     I have personally reviewed and noted the following in the patient's chart:   Medical and social history Use of alcohol, tobacco or illicit drugs  Current medications and supplements including opioid prescriptions. Patient is not currently taking opioid prescriptions. Functional ability and status Nutritional status Physical activity Advanced directives List of other physicians Hospitalizations, surgeries, and ER visits in previous 12 months Vitals Screenings to include cognitive, depression, and falls Referrals and appointments  In addition, I have reviewed  and discussed with patient certain preventive protocols, quality metrics, and best practice recommendations. A written personalized care plan for preventive services as well as general preventive health recommendations were provided to patient.     Kerin Perna, [facility]   01/18/2022   Nurse Notes: Face-To-Face 5 minute visit  Ms. Jimmye Norman , Thank you for taking time to come for your Medicare Wellness Visit. I appreciate your ongoing commitment to your health goals. Please review the following plan we discussed and let me know if I can assist you in the future.   These are the goals we discussed:  Goals      Blood Pressure < 140/90     LDL CALC < 100        This is a list of the screening recommended for you and due dates:  Health Maintenance  Topic Date Due   COVID-19 Vaccine (1) Never done   DEXA scan (bone density measurement)  Never done   Flu Shot  12/28/2021   Pneumonia Vaccine (1 - PCV) 06/29/2022*   Zoster (Shingles) Vaccine (1 of 2) 07/29/2022*   Mammogram  09/07/2023   Tetanus Vaccine  09/21/2023   Colon Cancer Screening  10/02/2023   Hepatitis C Screening: USPSTF Recommendation  to screen - Ages 73-79 yo.  Completed   HPV Vaccine  Aged Out  *Topic was postponed. The date shown is not the original due date.

## 2022-01-20 NOTE — Progress Notes (Signed)
Internal Medicine Clinic Attending  I saw and evaluated the patient.  I personally confirmed the key portions of the history and exam documented by Dr. August Saucer and I reviewed pertinent patient test results.  The assessment, diagnosis, and plan were formulated together and I agree with the documentation in the resident's note.   I was present for the procedure.

## 2022-02-02 NOTE — Progress Notes (Signed)
Internal Medicine Clinic Attending ? ?Case discussed with Dr. Dean  At the time of the visit.  We reviewed the resident?s history and exam and pertinent patient test results.  I agree with the assessment, diagnosis, and plan of care documented in the resident?s note.  ?

## 2022-04-06 ENCOUNTER — Other Ambulatory Visit: Payer: Self-pay | Admitting: Internal Medicine

## 2022-04-08 ENCOUNTER — Ambulatory Visit (INDEPENDENT_AMBULATORY_CARE_PROVIDER_SITE_OTHER): Payer: Medicare Other | Admitting: Internal Medicine

## 2022-04-08 VITALS — BP 155/71 | HR 80 | Temp 97.9°F | Ht 64.0 in | Wt 159.3 lb

## 2022-04-08 DIAGNOSIS — F17210 Nicotine dependence, cigarettes, uncomplicated: Secondary | ICD-10-CM | POA: Diagnosis not present

## 2022-04-08 DIAGNOSIS — Z720 Tobacco use: Secondary | ICD-10-CM

## 2022-04-08 DIAGNOSIS — Z8709 Personal history of other diseases of the respiratory system: Secondary | ICD-10-CM | POA: Diagnosis not present

## 2022-04-08 DIAGNOSIS — I10 Essential (primary) hypertension: Secondary | ICD-10-CM | POA: Diagnosis not present

## 2022-04-08 DIAGNOSIS — M170 Bilateral primary osteoarthritis of knee: Secondary | ICD-10-CM | POA: Diagnosis not present

## 2022-04-08 DIAGNOSIS — E1169 Type 2 diabetes mellitus with other specified complication: Secondary | ICD-10-CM

## 2022-04-08 DIAGNOSIS — E785 Hyperlipidemia, unspecified: Secondary | ICD-10-CM

## 2022-04-08 DIAGNOSIS — E119 Type 2 diabetes mellitus without complications: Secondary | ICD-10-CM | POA: Diagnosis not present

## 2022-04-08 MED ORDER — OLMESARTAN MEDOXOMIL 40 MG PO TABS: 40.0000 mg | ORAL_TABLET | Freq: Every day | ORAL | 11 refills | Status: DC

## 2022-04-08 MED ORDER — ALBUTEROL SULFATE HFA 108 (90 BASE) MCG/ACT IN AERS: 1.0000 | INHALATION_SPRAY | Freq: Four times a day (QID) | RESPIRATORY_TRACT | 1 refills | Status: AC | PRN

## 2022-04-08 MED ORDER — NICOTINE 14 MG/24HR TD PT24: 14.0000 mg | MEDICATED_PATCH | TRANSDERMAL | 1 refills | Status: AC

## 2022-04-08 NOTE — Assessment & Plan Note (Signed)
BP at not at goal. BP in clinic today 175/78, HR 100. Repeat improved to 155/71 but not at goal. Reported home readings with SBP 130s. Highest BP reading was 150s about twice in a 2 week period. Lowest BP is 120's. Monitor's BP few times a week so higher levels may be occurring more often. Home medications include Olmesartan 20 mg qd. Reports good medication compliance. Tolerating medication without adverse effects. Denies headaches, vision changes, lightheadedness, chest pain, SHOB, leg swelling or changes in speech. Exam benign and vitals otherwise wnl. Last creatinine was 0.68 in 08/2021. Counseled on the importance of daily exercise, low salt diet, and weight loss. States OTC potassium supplement as her potassium was lower previously. Smokes 1/2 ppd.  -Increase Olmesartan to 40 mg qd. Advised pt not to use OTC potassium as she is not on HCTZ anymore.  -BP log and bring to next visit  -Continue lifestyle changes -F/u on BMP today; BMP shows normal renal function.

## 2022-04-08 NOTE — Patient Instructions (Signed)
Ms.Satsuki E Yzaguirre, it was a pleasure seeing you today! You endorsed feeling well today. Below are some of the things we talked about this visit. We look forward to seeing you in the follow up appointment!  Today we discussed: We are increasing your blood pressure medication to 40 mg.  I am repeating lab work to see how we will change you cholesterol medications.  I have filled your other medications.  I have completed paper work for parking assistance during this visit.   I have ordered the following labs today:   Lab Orders         BMP8+Anion Gap         Lipid Profile         Hemoglobin A1c       Referrals ordered today:   Referral Orders  No referral(s) requested today     I have ordered the following medication/changed the following medications:   Stop the following medications: Medications Discontinued During This Encounter  Medication Reason   ezetimibe (ZETIA) 10 MG tablet Patient has not taken in last 30 days   diclofenac Sodium (VOLTAREN) 1 % GEL Patient has not taken in last 30 days   olmesartan (BENICAR) 20 MG tablet Change in therapy   albuterol (PROAIR HFA) 108 (90 Base) MCG/ACT inhaler Reorder     Start the following medications: Meds ordered this encounter  Medications   albuterol (PROAIR HFA) 108 (90 Base) MCG/ACT inhaler    Sig: Inhale 1-2 puffs into the lungs every 6 (six) hours as needed for wheezing or shortness of breath.    Dispense:  18 g    Refill:  1   olmesartan (BENICAR) 40 MG tablet    Sig: Take 1 tablet (40 mg total) by mouth daily.    Dispense:  30 tablet    Refill:  11     Follow-up: 6-8 week follow up with Dr. August Saucer  Please make sure to arrive 15 minutes prior to your next appointment. If you arrive late, you may be asked to reschedule.   We look forward to seeing you next time. Please call our clinic at [phone] if you have any questions or concerns. The best time to call is Monday-Friday from 9am-4pm, but there is someone  available 24/7. If after hours or the weekend, call the main hospital number and ask for the Internal Medicine Resident On-Call. If you need medication refills, please notify your pharmacy one week in advance and they will send [place] a request.  Thank you for letting us take part in your care. Wishing you the best!  Thank you, Gwenevere Abbot, MD

## 2022-04-08 NOTE — Assessment & Plan Note (Signed)
Patient has HLD with goal of primary prevention. Last lipid panel on 11/2020 showed Chol 249, LDL 173 .ASCVD risk was 31 %.  Reports non-adherence to Zetia but adherent to Crestor without adverse effects. Counseled on the importance of continued lifestyle modifications, including: weight loss, daily exercise, and healthy diet with limited processed and fatty foods.  -Increase Crestor to 20 mg qd. -Lipid panel ordered this visit. A1c ordered this visit. Lipid panel showed chol 176 and LDL 90, ASCD risk 23.6 but need high intensity statin due to elevated A1c meeting criteria for DMII.  -Encouraged to continue lifestyle modifications

## 2022-04-08 NOTE — Assessment & Plan Note (Signed)
Pt smoking 1/2 ppd. She is interested in quitting and offered NRT.  -Will follow up on her efforts at follow up visit.

## 2022-04-08 NOTE — Progress Notes (Unsigned)
CC: HTN follow up  HPI:  [name] is a 66 y.o. with medical history of HTN, HLD, allergic rhinitis, chronic knee pain 2/2 to osteoarthritis presenting to [facility] for HTN follow up.   Please see problem-based list for further details, assessments, and plans.  Past Medical History:  Diagnosis Date   Abnormal urine finding 09/20/2013   Allergic urticaria 04/10/2015   Allergy    Cough 10/14/2016   Folliculitis 09/01/2017   Foot pain, right 11/22/2017   H/O bronchitis 08/19/2011   Managed with albuterol inhaler   H/O hiatal hernia    Health care maintenance 07/01/2013   Healthcare maintenance 07/01/2013   Hyperlipidemia    Hypertension    Hypokalemia 10/29/2012   Knee effusion, left 04/02/2018   Musculoskeletal pain 09/20/2013   Tendinopathy of right rotator cuff 10/02/2017     Current Outpatient Medications (Cardiovascular):    olmesartan (BENICAR) 40 MG tablet, Take 1 tablet (40 mg total) by mouth daily.   rosuvastatin (CRESTOR) 5 MG tablet, Take 1 tablet (5 mg total) by mouth daily.  Current Outpatient Medications (Respiratory):    albuterol (PROAIR HFA) 108 (90 Base) MCG/ACT inhaler, Inhale 1-2 puffs into the lungs every 6 (six) hours as needed for wheezing or shortness of breath.  Current Outpatient Medications (Analgesics):    ibuprofen (ADVIL) 400 MG tablet, TAKE 1/2 (ONE-HALF) TABLET BY MOUTH EVERY 8 HOURS AS NEEDED   Current Outpatient Medications (Other):    nicotine (NICODERM CQ - DOSED IN MG/24 HOURS) 14 mg/24hr patch, Place 1 patch (14 mg total) onto the skin daily.   Review of Systems:  Review of system negative unless stated in the problem list or HPI.    Physical Exam:  Vitals:   04/08/22 0854 04/08/22 0950  BP: (!) 175/78 (!) 155/71  Pulse: 100 80  Temp: 97.9 F (36.6 C)   TempSrc: Oral   SpO2: 100%   Weight: 159 lb 4.8 oz (72.3 kg)   Height: 5\' 4"  (1.626 m)     Physical Exam General: NAD HENT: NCAT Lungs: CTAB, no wheeze, rhonchi or rales.   Cardiovascular: Normal heart sounds, no r/m/g, 2+ pulses in all extremities. No LE edema Abdomen: No TTP, normal bowel sounds MSK: No asymmetry or muscle atrophy. TPP of knees bilaterally.  Skin: no lesions noted on exposed skin Neuro: Alert and oriented x4. CN grossly intact Psych: Normal mood and normal affect   Assessment & Plan:   Hyperlipidemia Patient has HLD with goal of primary prevention. Last lipid panel on 11/2020 showed Chol 249, LDL 173 .ASCVD risk was 31 %.  Reports non-adherence to Zetia but adherent to Crestor without adverse effects. Counseled on the importance of continued lifestyle modifications, including: weight loss, daily exercise, and healthy diet with limited processed and fatty foods.  -Continue Rosuvastatin 5 mg. Adjust dose based on lipid panel.  -Lipid panel ordered this visit. A1c ordered this visit.  -Encouraged to continue lifestyle modifications   Essential hypertension BP at not at goal. BP in clinic today 175/78, HR 100. Repeat improved to 155/71 but not at goal. Reported home readings with SBP 130s. Highest BP reading was 150s about twice in a 2 week period. Lowest BP is 120's. Monitor's BP few times a week so higher levels may be occurring more often. Home medications include Olmesartan 20 mg qd. Reports good medication compliance. Tolerating medication without adverse effects. Denies headaches, vision changes, lightheadedness, chest pain, SHOB, leg swelling or changes in speech. Exam benign and vitals  otherwise wnl. Last creatinine was 0.68 in 08/2021. Counseled on the importance of daily exercise, low salt diet, and weight loss. States OTC potassium supplement as her potassium was lower previously. Smokes 1/2 ppd.  -Increase Olmesartan to 40 mg qd. Advised pt not to use OTC potassium as she is not on HCTZ anymore.  -BP log and bring to next visit  -Continue lifestyle changes -F/u on BMP today  Tobacco abuse Pt smoking 1/2 ppd. She is interested in  quitting and offered NRT.  -Will follow up on her efforts at follow up visit.   Osteoarthritis of knees, bilateral Patient states knees are doing alright at the moment but flare up from time to time limiting her activity. She wanted disability parking placard due to her knee osteoarthritis. She is able to ambulate but has pain after ambulation which limits her QOL. I believe this will provide her necessary relief.    See Encounters Tab for problem based charting.  Patient discussed with Dr. Karrie Meres, MD Eligha Bridegroom. [facility] Internal Medicine Residency, PGY-2

## 2022-04-08 NOTE — Assessment & Plan Note (Signed)
Patient states knees are doing alright at the moment but flare up from time to time limiting her activity. She wanted disability parking placard due to her knee osteoarthritis. She is able to ambulate but has pain after ambulation which limits her QOL. I believe this will provide her necessary relief.

## 2022-04-11 ENCOUNTER — Telehealth: Payer: Self-pay | Admitting: Internal Medicine

## 2022-04-11 DIAGNOSIS — E119 Type 2 diabetes mellitus without complications: Secondary | ICD-10-CM | POA: Insufficient documentation

## 2022-04-11 LAB — BMP8+ANION GAP
Anion Gap: 14 mmol/L (ref 10.0–18.0)
BUN/Creatinine Ratio: 18 (ref 12–28)
BUN: 10 mg/dL (ref 8–27)
CO2: 24 mmol/L (ref 20–29)
Calcium: 9.8 mg/dL (ref 8.7–10.3)
Chloride: 103 mmol/L (ref 96–106)
Creatinine, Ser: 0.57 mg/dL (ref 0.57–1.00)
Glucose: 98 mg/dL (ref 70–99)
Potassium: 3.9 mmol/L (ref 3.5–5.2)
Sodium: 141 mmol/L (ref 134–144)
eGFR: 100 mL/min/{1.73_m2} (ref >59)

## 2022-04-11 LAB — HEMOGLOBIN A1C
Est. average glucose Bld gHb Est-mCnc: 140 mg/dL
Hgb A1c MFr Bld: 6.5 % — ABNORMAL HIGH (ref 4.8–5.6)

## 2022-04-11 LAB — LIPID PANEL
Chol/HDL Ratio: 2.8 ratio (ref 0.0–4.4)
Cholesterol, Total: 176 mg/dL (ref 100–199)
HDL: 63 mg/dL (ref >39)
LDL Chol Calc (NIH): 90 mg/dL (ref 0–99)
Triglycerides: 133 mg/dL (ref 0–149)
VLDL Cholesterol Cal: 23 mg/dL (ref 5–40)

## 2022-04-11 MED ORDER — ROSUVASTATIN CALCIUM 20 MG PO TABS: 20.0000 mg | ORAL_TABLET | Freq: Every day | ORAL | 2 refills | Status: DC

## 2022-04-11 NOTE — Telephone Encounter (Signed)
Pt rtn call about her lab results.  Pt states she rec'd a call just now but no name was left.

## 2022-04-11 NOTE — Assessment & Plan Note (Signed)
A1c is 6.5. Will repeat in 3 months. A1c currently at goal with value less than 7. No need to start any medications at this time.

## 2022-04-18 NOTE — Progress Notes (Signed)
Internal Medicine Clinic Attending  Case discussed with the resident at the time of the visit.  We reviewed the resident's history and exam and pertinent patient test results.  I agree with the assessment, diagnosis, and plan of care documented in the resident's note.  

## 2022-05-05 DIAGNOSIS — H25813 Combined forms of age-related cataract, bilateral: Secondary | ICD-10-CM | POA: Diagnosis not present

## 2022-06-07 ENCOUNTER — Encounter: Payer: Medicare Other | Admitting: Internal Medicine

## 2022-06-07 NOTE — Progress Notes (Deleted)
HTN: BP at goal today, *. Current regimen is olmesartan 40 mg daily. Review of home BP log shows *. Last BMP 03/2022 with normal renal function. Plan:  T2DM: HbA1c 03/2022 6.5%. Plan:Recheck at next OV.  OA B/L KNEES  HLD: Current regimen is rosuvastatin 20 mg daily. Last lipid profile 03/2022 with LDL 90.  Plan:Continue current regimen. Recheck lipid profile at next OV.

## 2022-06-16 ENCOUNTER — Other Ambulatory Visit: Payer: Self-pay

## 2022-06-16 ENCOUNTER — Ambulatory Visit (INDEPENDENT_AMBULATORY_CARE_PROVIDER_SITE_OTHER): Payer: Medicare Other | Admitting: Student

## 2022-06-16 ENCOUNTER — Encounter: Payer: Self-pay | Admitting: Student

## 2022-06-16 VITALS — BP 176/67 | HR 96 | Temp 99.1°F | Resp 36 | Ht 64.0 in | Wt 162.4 lb

## 2022-06-16 DIAGNOSIS — F17210 Nicotine dependence, cigarettes, uncomplicated: Secondary | ICD-10-CM

## 2022-06-16 DIAGNOSIS — M170 Bilateral primary osteoarthritis of knee: Secondary | ICD-10-CM

## 2022-06-16 DIAGNOSIS — I10 Essential (primary) hypertension: Secondary | ICD-10-CM

## 2022-06-16 DIAGNOSIS — Z720 Tobacco use: Secondary | ICD-10-CM

## 2022-06-16 MED ORDER — VARENICLINE TARTRATE 1 MG PO TABS: ORAL_TABLET | ORAL | 1 refills | Status: DC

## 2022-06-16 MED ORDER — DICLOFENAC SODIUM 1 % EX GEL: 4.0000 g | Freq: Four times a day (QID) | CUTANEOUS | 3 refills | Status: AC

## 2022-06-16 MED ORDER — AMLODIPINE-OLMESARTAN 5-40 MG PO TABS: 1.0000 | ORAL_TABLET | Freq: Every day | ORAL | 1 refills | Status: DC

## 2022-06-16 NOTE — Patient Instructions (Signed)
Thank you, Ms.Dea E Dubach for allowing [place] to provide your care today. Today we discussed your arthritis pain, high blood pressure and smoking.   For your blood pressure, I have started you on a new combo pill to help with your blood pressure.  Check your blood pressure daily at home and bring your cuff to next office visit.  For the smoking, I am starting you on Chantix to help decrease your cravings and help you quit over the next few weeks.  Your arthritis, continue using the Voltaren gel and over-the-counter Tylenol and ibuprofen for the pain.  Pain is not improved, we will consider steroid injection at your next office visit.  I have place a referrals to ophthalmology for diabetic eye exam   I have ordered the following medication/changed the following medications:  Start Chantix, take 0.5 mg daily for 3 days, then 0.5 mg twice daily for 4 days then 1 mg twice daily Start amlodipine-olmesartan 5-40 mg daily Apply Voltaren gel 4 times a day as needed Continue to take OTC Tylenol and ibuprofen for knee pain  My Chart Access: [email or web address]  Please follow-up in 2 weeks  Please make sure to arrive 15 minutes prior to your next appointment. If you arrive late, you may be asked to reschedule.    We look forward to seeing you next time. Please call our clinic at [phone] if you have any questions or concerns. The best time to call is Monday-Friday from 9am-4pm, but there is someone available 24/7. If after hours or the weekend, call the main hospital number and ask for the Internal Medicine Resident On-Call. If you need medication refills, please notify your pharmacy one week in advance and they will send [place] a request.   Thank you for letting us take part in your care. Wishing you the best!  Lacinda Axon, MD 06/16/2022, 10:18 AM IM Resident, PGY-3 [facility] 41:10

## 2022-06-16 NOTE — Progress Notes (Signed)
CC: BP follow-up, knee pain  HPI:  [name] is a 67 y.o. female with PMH as below who presents to follow-up on her blood pressure and for evaluation of her knee pain. Please see problem based charting for evaluation, assessment and plan.  Past Medical History:  Diagnosis Date   Abnormal urine finding 09/20/2013   Allergic urticaria 04/10/2015   Allergy    Cough 06/07/3233   Folliculitis 10/04/3218   Foot pain, right 11/22/2017   H/O bronchitis 08/19/2011   Managed with albuterol inhaler   H/O hiatal hernia    Health care maintenance 07/01/2013   Healthcare maintenance 07/01/2013   Hyperlipidemia    Hypertension    Hypokalemia 10/29/2012   Knee effusion, left 04/02/2018   Musculoskeletal pain 09/20/2013   Tendinopathy of right rotator cuff 10/02/2017    Review of Systems:  Constitutional: Negative for fever or fatigue Eyes: Negative for visual changes Respiratory: Negative for shortness of breath Cardiac: Negative for chest pain.  Positive for occasional palpitations. MSK: Positive for chronic knee pain. Negative for back pain Neuro: Negative for headache or weakness  Physical Exam: General: Pleasant, well-appearing elderly woman.  No acute distress. Cardiac: Tachycardic. Regular rhythm. No murmurs, rubs or gallops. No LE edema Respiratory: Lungs CTAB. No wheezing or crackles. Abdominal: Soft, symmetric and non tender. Normal BS. Skin: Warm, dry and intact without rashes or lesions MSK: Mild tenderness with palpation of bilateral knee, no crepitus or effusion. Normal range of motion of the knee. Neuro: A&O x 3. Moves all extremities. Normal sensation to gross touch. Psych: Mildly frustrated mood.   Vitals:   06/16/22 0936 06/16/22 0949 06/16/22 1015  BP: (!) 180/77 (!) 185/81 (!) 176/67  Pulse: 99 97 96  Resp: (!) 36    Temp: 99.1 F (37.3 C)    TempSrc: Oral    SpO2: 100%    Weight: 162 lb 6.4 oz (73.7 kg)    Height: 5\' 4"  (1.626 m)       Assessment & Plan:    Essential hypertension SBP in the 150s to 170s to the last office visit. Her olmesartan was increased from 20 mg to 40 mg daily. Patient has BP still elevated with SBP in the 170s to 180s. Patient reports she smoke a cigarette this morning. Also states that her blood pressure at home is much lower than at the office with SBP usually in the 130s to 140s and that she has whitecoat syndrome. States she forgot to bring her blood pressure log. She reports constant knee pain and occasional palpitations but denies any dizziness, chest pain headaches or shortness of breath. Discussed plan to start a combo pill for better control of her BP, patient initially hesitant but finally agreed to try this new regimen. States the HCTZ worked well for her blood pressure but caused her to have low potassiums which is why she was taken off.  Plan: -Discontinue olmesartan 40 mg daily -Start amlodipine-olmesartan 5-40 mg daily -Follow-up in 2 weeks with BP log and blood pressure cuff  Tobacco abuse Patient reports she continues to smoke about 1/2 ppd of cigarettes. She understands that the cigarette smoking is likely contributing to her high blood pressures as well as the occasional palpitations. She is interested in trying medications to help her quit. She has heard about Wellbutrin and its side effects so she is interested in an alternative agent.  Patient counseled that we will start the medication but it would likely take a few weeks for  her to completely quit.  Plan: -Start Chantix, take 0.5 mg daily for 3 days, then 0.5 mg twice daily for 4 days then 1 mg twice daily -Follow in 2 weeks for re-evaluation  Osteoarthritis of knees, bilateral Patient with a history of bilateral osteoarthritis. States her knee pain has been flaring up more frequently in the last few weeks.  He has tried OTC NSAIDs and Tylenol without significant relief.  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 States that she has had steroid injections in the past that worked for her for about 6 months.  Discussed plan to continue conservative therapy for the next 2 weeks and reevaluate for possible steroid injection at next office visit if her pain is not better.  Plan: -Voltaren gel, apply 4 times daily -Continue OTC NSAIDs and Tylenol q6h for knee pain -Follow-up in 2 weeks for reassessment    See Encounters Tab for problem based charting.  Patient discussed with Dr.  Dani Gobble, MD, MPH

## 2022-06-18 ENCOUNTER — Encounter: Payer: Self-pay | Admitting: Student

## 2022-06-18 NOTE — Assessment & Plan Note (Addendum)
SBP in the 150s to 170s to the last office visit. Her olmesartan was increased from 20 mg to 40 mg daily. Patient has BP still elevated with SBP in the 170s to 180s. Patient reports she smoke a cigarette this morning. Also states that her blood pressure at home is much lower than at the office with SBP usually in the 130s to 140s and that she has whitecoat syndrome. States she forgot to bring her blood pressure log. She reports constant knee pain and occasional palpitations but denies any dizziness, chest pain headaches or shortness of breath. Discussed plan to start a combo pill for better control of her BP, patient initially hesitant but finally agreed to try this new regimen. States the HCTZ worked well for her blood pressure but caused her to have low potassiums which is why she was taken off.  Plan: -Discontinue olmesartan 40 mg daily -Start amlodipine-olmesartan 5-40 mg daily -Follow-up in 2 weeks with BP log and blood pressure cuff

## 2022-06-18 NOTE — Assessment & Plan Note (Signed)
Patient reports she continues to smoke about 1/2 ppd of cigarettes. She understands that the cigarette smoking is likely contributing to her high blood pressures as well as the occasional palpitations. She is interested in trying medications to help her quit. She has heard about Wellbutrin and its side effects so she is interested in an alternative agent.  Patient counseled that we will start the medication but it would likely take a few weeks for her to completely quit.  Plan: -Start Chantix, take 0.5 mg daily for 3 days, then 0.5 mg twice daily for 4 days then 1 mg twice daily -Follow in 2 weeks for re-evaluation

## 2022-06-18 NOTE — Assessment & Plan Note (Signed)
Patient with a history of bilateral osteoarthritis. States her knee pain has been flaring up more frequently in the last few weeks.  He has tried OTC NSAIDs and Tylenol without significant relief.  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 States that she has had steroid injections in the past that worked for her for about 6 months.  Discussed plan to continue conservative therapy for the next 2 weeks and reevaluate for possible steroid injection at next office visit if her pain is not better.  Plan: -Voltaren gel, apply 4 times daily -Continue OTC NSAIDs and Tylenol q6h for knee pain -Follow-up in 2 weeks for reassessment

## 2022-06-20 NOTE — Progress Notes (Signed)
Internal Medicine Clinic Attending  Case discussed with Dr. Amponsah  At the time of the visit.  We reviewed the resident's history and exam and pertinent patient test results.  I agree with the assessment, diagnosis, and plan of care documented in the resident's note.  

## 2022-07-19 ENCOUNTER — Encounter: Payer: Medicare Other | Admitting: Student

## 2022-07-27 ENCOUNTER — Ambulatory Visit (INDEPENDENT_AMBULATORY_CARE_PROVIDER_SITE_OTHER): Payer: Medicare Other | Admitting: Student

## 2022-07-27 ENCOUNTER — Encounter: Payer: Self-pay | Admitting: Student

## 2022-07-27 VITALS — BP 166/73 | HR 89 | Temp 98.1°F | Ht 65.0 in | Wt 165.5 lb

## 2022-07-27 DIAGNOSIS — M170 Bilateral primary osteoarthritis of knee: Secondary | ICD-10-CM

## 2022-07-27 NOTE — Assessment & Plan Note (Signed)
Patient reports her right knee has been particularly bothersome lately. She has tried Tylenol, Advil, and voltaren gel in the past with minimal relief. She has had previous steroid injection in her knee which improved her knee pain for six months. She is presenting today to get steroid injection in the right knee.   Discussed procedure with patient, including risks and benefits. Also discussed with patient to return to clinic if she would like steroid injection of her left knee.

## 2022-07-27 NOTE — Progress Notes (Signed)
   CC: R knee pain  HPI:  Caitlyn Ingram is a 67 y.o. person with medical history as below presenting to [facility] for R knee pain.  Please see problem-based list for further details, assessments, and plans.  Past Medical History:  Diagnosis Date   Abnormal urine finding 09/20/2013   Allergic urticaria 04/10/2015   Allergy    Cough [id number]   Folliculitis 0000000   Foot pain, right 11/22/2017   H/O bronchitis 08/19/2011   Managed with albuterol inhaler   H/O hiatal hernia    Health care maintenance 07/01/2013   Healthcare maintenance 07/01/2013   Hyperlipidemia    Hypertension    Hypokalemia 10/29/2012   Knee effusion, left 04/02/2018   Musculoskeletal pain 09/20/2013   Tendinopathy of right rotator cuff 10/02/2017   Review of Systems:  As per HPI  Physical Exam:  Vitals:   07/27/22 1602  BP: (!) 166/73  Pulse: 89  Temp: 98.1 F (36.7 C)  TempSrc: Oral  SpO2: 100%  Weight: 165 lb 8 oz (75.1 kg)  Height: 5' 5"$  (1.651 m)   General: Resting comfortably in chair in no acute distress Pulm: Normal work of breathing on room air. MSK: Normal bulk, tone. No knee effusions bilaterally.  Skin: Warm, dry. No rashes or lesions noted. Neuro: Awake, alert, conversing appropriately.  Psych: Normal mood, affect, speech.   Assessment & Plan:   Osteoarthritis of knees, bilateral Patient reports her right knee has been particularly bothersome lately. She has tried Tylenol, Advil, and voltaren gel in the past with minimal relief. She has had previous steroid injection in her knee which improved her knee pain for six months. She is presenting today to get steroid injection in the right knee.   Discussed procedure with patient, including risks and benefits. Also discussed with patient to return to clinic if she would like steroid injection of her left knee.  Patient discussed with Dr. Philipp Ovens  PROCEDURE NOTE  PROCEDURE: right knee joint steroid injection.  PREOPERATIVE DIAGNOSIS:  Osteoarthritis of the right knee.  POSTOPERATIVE DIAGNOSIS: Osteoarthritis of the right knee.  PROCEDURE: The patient was apprised of the risks and the benefits of the procedure and informed consent was obtained, as witnessed by Dr. Philipp Ovens. Time-out procedure was performed, with confirmation of the patient's name, date of birth, and correct identification of the right knee to be injected. The patient's knee was then marked at the appropriate site for injection placement. The knee was sterilely prepped with Betadine. A 10 mg (1 milliliter) solution of Kenalog was drawn up into a 5 mL syringe with a 2 mL of 1% lidocaine. The patient was injected with a 25-gauge needle at the inferior-lateral aspect of her right flexed knee. There were no complications. The patient tolerated the procedure well. There was minimal bleeding. The patient was instructed to ice her knee upon leaving clinic and refrain from overuse over the next 3 days. The patient was instructed to go to the emergency room with any usual pain, swelling, or redness occurred in the injected area. The patient was given a followup appointment to evaluate response to the injection to his increased range of motion and reduction of pain.  The procedure was supervised by attending physician, Dr. Philipp Ovens.   Sanjuan Dame, MD Internal Medicine PGY-3 Pager: [phone]

## 2022-07-27 NOTE — Patient Instructions (Signed)
Ms.Shy E Spedale, it was a pleasure seeing you today!  Today we discussed: - Knee injection: Let us know if you have any bleeding or worsening pain in the area. When you return we can inject the other knee.   Follow-up:  as needed    Please make sure to arrive 15 minutes prior to your next appointment. If you arrive late, you may be asked to reschedule.   We look forward to seeing you next time. Please call our clinic at [phone] if you have any questions or concerns. The best time to call is Monday-Friday from 9am-4pm, but there is someone available 24/7. If after hours or the weekend, call the main hospital number and ask for the Internal Medicine Resident On-Call. If you need medication refills, please notify your pharmacy one week in advance and they will send [place] a request.  Thank you for letting us take part in your care. Wishing you the best!  Thank you, Sanjuan Dame, MD

## 2022-07-28 NOTE — Progress Notes (Signed)
Internal Medicine Clinic Attending  I saw and evaluated the patient.  I personally confirmed the key portions of the history and exam documented by Dr. Collene Gobble and I reviewed pertinent patient test results.  The assessment, diagnosis, and plan were formulated together and I agree with the documentation in the resident's note.   I was present for the procedure.

## 2022-07-28 NOTE — Addendum Note (Signed)
Addended by: Jodean Lima on: 07/28/2022 10:33 AM   Modules accepted: Level of Service

## 2022-08-23 ENCOUNTER — Other Ambulatory Visit: Payer: Self-pay

## 2022-08-23 DIAGNOSIS — E785 Hyperlipidemia, unspecified: Secondary | ICD-10-CM

## 2022-08-23 MED ORDER — ROSUVASTATIN CALCIUM 20 MG PO TABS: 20.0000 mg | ORAL_TABLET | Freq: Every day | ORAL | 2 refills | Status: DC

## 2022-10-03 ENCOUNTER — Encounter: Payer: Self-pay | Admitting: Student

## 2022-10-03 ENCOUNTER — Ambulatory Visit (INDEPENDENT_AMBULATORY_CARE_PROVIDER_SITE_OTHER): Payer: Medicare Other | Admitting: Student

## 2022-10-03 VITALS — BP 182/88 | HR 94 | Temp 98.2°F | Wt 164.0 lb

## 2022-10-03 DIAGNOSIS — I10 Essential (primary) hypertension: Secondary | ICD-10-CM | POA: Diagnosis not present

## 2022-10-03 DIAGNOSIS — F17210 Nicotine dependence, cigarettes, uncomplicated: Secondary | ICD-10-CM | POA: Diagnosis not present

## 2022-10-03 DIAGNOSIS — Z720 Tobacco use: Secondary | ICD-10-CM

## 2022-10-03 DIAGNOSIS — M170 Bilateral primary osteoarthritis of knee: Secondary | ICD-10-CM

## 2022-10-03 NOTE — Assessment & Plan Note (Signed)
Patient states that since her last injection, she has had a fall, and her pain returned back.  She states about a week after getting her injection she had a fall where she fell on her knees.  She denies any fractures or any cracks, but states her pain has returned.  Started to be achy.  She states her day-to-day activities have become harder to do due to the pain.  She denies any fevers or swelling.  She states that she would like to get knee injections.  She does not want to take medications.  She has tried Tylenol, ibuprofen, lidocaine creams, and arthritis cream with no relief.  She states she had a repeat injection in the past that lasted 8 months, and thinks that her injection wore off faster this time because she fell.  Plan: -Will inject bilateral knees today -Discussed procedure with patient, including risks and benefits in which patient agreed to and accepted

## 2022-10-03 NOTE — Patient Instructions (Signed)
Caitlyn Ingram, Caitlyn Ingram you for allowing me to take part in your care today.  Here are your instructions.  1. Regarding your knees, we have done 2 knee injections today. You might have some soreness from the injection sites. Please use ice if you have some soreness. If you develop fevers ,chills, or your knee gets very hot, swollen or tender, please go to the nearest emergency department.  2. Please keep a blood pressure log and bring it in your next visit in 1 month and at that time we can check your electrolytes.   Thank you, Dr. Allena Katz  If you have any other questions please contact the internal medicine clinic at [phone]

## 2022-10-03 NOTE — Progress Notes (Signed)
CC: Leg pain   HPI:  [name] is a 67 y.o. female with past medical history of bilateral osteoarthritis of knees and hypertension who presents for concerns of bilateral knee pain.  Please see assessment and plan for full HPI.  Past Medical History:  Diagnosis Date   Abnormal urine finding 09/20/2013   Allergic urticaria 04/10/2015   Allergy    Cough 10/14/2016   Folliculitis 09/01/2017   Foot pain, right 11/22/2017   H/O bronchitis 08/19/2011   Managed with albuterol inhaler   H/O hiatal hernia    Health care maintenance 07/01/2013   Healthcare maintenance 07/01/2013   Hyperlipidemia    Hypertension    Hypokalemia 10/29/2012   Knee effusion, left 04/02/2018   Musculoskeletal pain 09/20/2013   Tendinopathy of right rotator cuff 10/02/2017     Current Outpatient Medications:    albuterol (PROAIR HFA) 108 (90 Base) MCG/ACT inhaler, Inhale 1-2 puffs into the lungs every 6 (six) hours as needed for wheezing or shortness of breath., Disp: 18 g, Rfl: 1   amLODipine-olmesartan (AZOR) 5-40 MG tablet, Take 1 tablet by mouth daily., Disp: 30 tablet, Rfl: 1   diclofenac Sodium (VOLTAREN ARTHRITIS PAIN) 1 % GEL, Apply 4 g topically 4 (four) times daily., Disp: 150 g, Rfl: 3   ibuprofen (ADVIL) 400 MG tablet, TAKE 1/2 (ONE-HALF) TABLET BY MOUTH EVERY 8 HOURS AS NEEDED, Disp: 90 tablet, Rfl: 0   rosuvastatin (CRESTOR) 20 MG tablet, Take 1 tablet (20 mg total) by mouth daily., Disp: 90 tablet, Rfl: 2  Review of Systems:   MSK: Patient endorses bilateral knee pain  Physical Exam:  Vitals:   10/03/22 1521 10/03/22 1652  BP: (!) 164/76 (!) 182/88  Pulse: 98 94  Temp: 98.2 F (36.8 C)   TempSrc: Oral   SpO2: 100%   Weight: 164 lb (74.4 kg)     General: Patient is sitting comfortably in the room  Cardio: Regular rate and rhythm, no murmurs, rubs or gallops. Pulmonary: Clear to ausculation bilaterally with no rales, rhonchi, and crackles  Extremities: Bilateral lower extremities with  decreased range of motion secondary to pain.  No bony tenderness appreciated.  No joint laxity appreciated.  Negative anterior drawer bilaterally.  No signs of obvious effusions bilaterally.  No signs of infection bilaterally.   Assessment & Plan:   Essential hypertension Patient presents to the clinic with concerns of bilateral knee pain.  On exam, patient found to have elevated blood pressures into the 150s systolics with max blood pressure in the 180s systolics.  She denies any vision changes or headaches.  She states that she gets whitecoat hypertension.  She states at home, she measures in the systolics of 120s to 130s.  She reports compliance with her medications.  She does report that she does not want to start any new medications.  On further chart review, patient does have elevated blood pressures every visit.  I did counsel her on the importance of controlling blood pressure which she understands, but states she rather than lifestyle changes.  Plan: -Continue amlodipine-olmesartan 5-40 mg daily -Follow-up in 4 weeks -Keep blood pressure log -Encouraged lifestyle modifications  Osteoarthritis of knees, bilateral Patient states that since her last injection, she has had a fall, and her pain returned back.  She states about a week after getting her injection she had a fall where she fell on her knees.  She denies any fractures or any cracks, but states her pain has returned.  Started to  be achy.  She states her day-to-day activities have become harder to do due to the pain.  She denies any fevers or swelling.  She states that she would like to get knee injections.  She does not want to take medications.  She has tried Tylenol, ibuprofen, lidocaine creams, and arthritis cream with no relief.  She states she had a repeat injection in the past that lasted 8 months, and thinks that her injection wore off faster this time because she fell.  Plan: -Will inject bilateral knees today -Discussed  procedure with patient, including risks and benefits in which patient agreed to and accepted  Tobacco abuse Patient states that she never started her Chantix given that she did not want to take this medication.  She states that she has slowed down on smoking and is down to 4 cigarettes a day from 10 cigarettes a day.  She states she will continue to slowly come down on her smoking.  Plan: -Discontinue Chantix given patient does not seem to be compliant -Encourage smoking cessation   Procedure Note   Indication:  Bilateral Knee Pain 2/2 to knee OA   Operators: Drs Caprina Wussow/Guilloud   The patient was provided with risks, benefits, and alternatives to intraarticular injection. She consented to intraarticular knee injection for Relief of bilateral knee pain.  After a time out was preformed, the knee was prepped in a sterile fashion. Cold spray was applied to the skin over the insertion site (lateral inferior patellar aspect of the knee while in the seated position). A mixture of 2 cc 1% xylocaine and 40 mg of Kenalog was injected into both knees using a 25 gauge 1 and 1/2 inch needle.  Both knee spaces were entered without difficulty, entered successfully after one attempt. The patient tolerated the procedures well without complication.   Patient seen with Dr. Oren Bracket, DO PGY-1 Internal Medicine Resident  Pager: [phone]

## 2022-10-03 NOTE — Assessment & Plan Note (Signed)
Patient states that she never started her Chantix given that she did not want to take this medication.  She states that she has slowed down on smoking and is down to 4 cigarettes a day from 10 cigarettes a day.  She states she will continue to slowly come down on her smoking.  Plan: -Discontinue Chantix given patient does not seem to be compliant -Encourage smoking cessation

## 2022-10-03 NOTE — Assessment & Plan Note (Signed)
Patient presents to the clinic with concerns of bilateral knee pain.  On exam, patient found to have elevated blood pressures into the 150s systolics with max blood pressure in the 180s systolics.  She denies any vision changes or headaches.  She states that she gets whitecoat hypertension.  She states at home, she measures in the systolics of 120s to 130s.  She reports compliance with her medications.  She does report that she does not want to start any new medications.  On further chart review, patient does have elevated blood pressures every visit.  I did counsel her on the importance of controlling blood pressure which she understands, but states she rather than lifestyle changes.  Plan: -Continue amlodipine-olmesartan 5-40 mg daily -Follow-up in 4 weeks -Keep blood pressure log -Encouraged lifestyle modifications

## 2022-10-04 NOTE — Progress Notes (Signed)
Internal Medicine Clinic Attending  I saw and evaluated the patient.  I personally confirmed the key portions of the history and exam documented by Dr. Allena Katz and I reviewed pertinent patient test results.  The assessment, diagnosis, and plan were formulated together and I agree with the documentation in the resident's note.   I was present for the procedure.

## 2022-10-06 ENCOUNTER — Other Ambulatory Visit: Payer: Self-pay | Admitting: Internal Medicine

## 2022-10-06 DIAGNOSIS — Z1231 Encounter for screening mammogram for malignant neoplasm of breast: Secondary | ICD-10-CM

## 2022-10-18 ENCOUNTER — Ambulatory Visit: Payer: Medicare Other

## 2022-10-31 DIAGNOSIS — H25813 Combined forms of age-related cataract, bilateral: Secondary | ICD-10-CM | POA: Diagnosis not present

## 2022-11-07 ENCOUNTER — Ambulatory Visit
Admission: RE | Admit: 2022-11-07 | Discharge: 2022-11-07 | Disposition: A | Discharge status: Home / Self Care | Payer: Medicare Other | Source: Ambulatory Visit | Attending: Family Medicine | Admitting: Family Medicine

## 2022-11-07 DIAGNOSIS — Z1231 Encounter for screening mammogram for malignant neoplasm of breast: Secondary | ICD-10-CM | POA: Diagnosis not present

## 2022-12-02 ENCOUNTER — Other Ambulatory Visit: Payer: Self-pay | Admitting: Internal Medicine

## 2023-02-08 ENCOUNTER — Ambulatory Visit (INDEPENDENT_AMBULATORY_CARE_PROVIDER_SITE_OTHER): Payer: Medicare Other | Admitting: Student

## 2023-02-08 DIAGNOSIS — J329 Chronic sinusitis, unspecified: Secondary | ICD-10-CM | POA: Insufficient documentation

## 2023-02-08 DIAGNOSIS — J0110 Acute frontal sinusitis, unspecified: Secondary | ICD-10-CM

## 2023-02-08 DIAGNOSIS — J019 Acute sinusitis, unspecified: Secondary | ICD-10-CM | POA: Insufficient documentation

## 2023-02-08 NOTE — Progress Notes (Signed)
   CC: Sinus infection  This is a telephone encounter between Caitlyn Ingram and Caitlyn Ingram on 02/08/2023 for sinus infection. The visit was conducted with the patient located at home and Caitlyn Ingram at [facility]. The patient's identity was confirmed using their DOB and current address. The patient has consented to being evaluated through a telephone encounter and understands the associated risks (an examination cannot be done and the patient may need to come in for an appointment) / benefits (allows the patient to remain at home, decreasing exposure to coronavirus). I personally spent 15 minutes on medical discussion.   HPI:  [name] is a 67 y.o. with PMH as below.   Please see A&P for assessment of the patient's acute and chronic medical conditions.   Past Medical History:  Diagnosis Date   Abnormal urine finding 09/20/2013   Allergic urticaria 04/10/2015   Allergy    Cough 10/14/2016   Folliculitis 09/01/2017   Foot pain, right 11/22/2017   H/O bronchitis 08/19/2011   Managed with albuterol inhaler   H/O hiatal hernia    Health care maintenance 07/01/2013   Healthcare maintenance 07/01/2013   Hyperlipidemia    Hypertension    Hypokalemia 10/29/2012   Knee effusion, left 04/02/2018   Musculoskeletal pain 09/20/2013   Tendinopathy of right rotator cuff 10/02/2017   Review of Systems:  negative except as noted in problem-based assessment and plan.   Assessment & Plan:   Sinus infection Patient says she has been having 3-4 days of sinus congestion and pressure.  She denies any other URI symptoms including fever, cough, chest congestion.  Of note, she has a history of allergic rhinitis.  She has been using Flonase several times daily, has been taking Zyrtec, has been drinking plenty of fluids, but has seen no improvement.  She says she is having trouble sleeping because of the sinus pressure.  She has been using Coricidin, which she says improved her symptoms but makes her dizzy.  I  encouraged her to try taking it in the nighttime instead of the daytime, since it makes her dizzy, as it may help with her sleep.   She was requesting antibiotics since she had a similar episode of sinus infection last year which was resolved with amoxicillin.  I informed her that most cases of sinus infection are caused by viruses not bacteria, so antibiotics may not relieve her symptoms.  The improvement of her symptoms with coricidin further supports that this is less likely bacterial infection.   Since her symptoms have only been ongoing for a few days, I am recommending continuing conservative treatment including Flonase, nasal saline, Zyrtec, and Coricidin nightly.  I told her if her symptoms do not improve significantly by Monday, then her sinus infection may be bacterial rather than viral. She should call the clinic and we will send in a prescription for amoxicillin.   Patient seen with Dr. [place], MD Internal Medicine Resident

## 2023-02-08 NOTE — Assessment & Plan Note (Signed)
Patient says she has been having 3-4 days of sinus congestion and pressure.  She denies any other URI symptoms including fever, cough, chest congestion.  Of note, she has a history of allergic rhinitis.  She has been using Flonase several times daily, has been taking Zyrtec, has been drinking plenty of fluids, but has seen no improvement.  She says she is having trouble sleeping because of the sinus pressure.  She has been using Coricidin, which she says improved her symptoms but makes her dizzy.  I encouraged her to try taking it in the nighttime instead of the daytime, since it makes her dizzy, as it may help with her sleep.   She was requesting antibiotics since she had a similar episode of sinus infection last year which was resolved with amoxicillin.  I informed her that most cases of sinus infection are caused by viruses not bacteria, so antibiotics may not relieve her symptoms.  The improvement of her symptoms with coricidin further supports that this is less likely bacterial infection.   Since her symptoms have only been ongoing for a few days, I am recommending continuing conservative treatment including Flonase, nasal saline, Zyrtec, and Coricidin nightly.  I told her if her symptoms do not improve significantly by Monday, then her sinus infection may be bacterial rather than viral. She should call the clinic and we will send in a prescription for amoxicillin.

## 2023-02-08 NOTE — Progress Notes (Deleted)
tele

## 2023-02-17 ENCOUNTER — Ambulatory Visit (INDEPENDENT_AMBULATORY_CARE_PROVIDER_SITE_OTHER): Payer: Medicare Other | Admitting: Internal Medicine

## 2023-02-17 DIAGNOSIS — J329 Chronic sinusitis, unspecified: Secondary | ICD-10-CM

## 2023-02-17 DIAGNOSIS — J019 Acute sinusitis, unspecified: Secondary | ICD-10-CM

## 2023-02-17 DIAGNOSIS — B9689 Other specified bacterial agents as the cause of diseases classified elsewhere: Secondary | ICD-10-CM

## 2023-02-17 MED ORDER — AMOXICILLIN-POT CLAVULANATE 500-125 MG PO TABS
1.0000 | ORAL_TABLET | Freq: Three times a day (TID) | ORAL | 0 refills | Status: AC
Start: 2023-02-17 — End: 2023-02-22

## 2023-02-17 NOTE — Assessment & Plan Note (Signed)
Pt with complaint of sinus congestion, headache, with frequent nasal drainage and sore throat that has been present for 14 days. She is also reporting chills. Given the duration, suspect she has developed bacterial sinusitis. 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 Will treat her with Augmentin for 5 days given high likelihood this can be due to hemophilus influenza given her work which has known resistance to amoxicillin. Advised to continue Zyrtec, Flonase and neti-pot given her hx of allergic rhinitis to minimize her symptoms. Pt given return precautions.

## 2023-02-17 NOTE — Progress Notes (Signed)
  [facility] Internal Medicine Residency Telephone Encounter Continuity Care Appointment  HPI:  This telephone encounter was created for [name] on 02/17/2023 for the following purpose/cc sinusitis.   Past Medical History:  Past Medical History:  Diagnosis Date   Abnormal urine finding 09/20/2013   Allergic urticaria 04/10/2015   Allergy    Cough 10/14/2016   Folliculitis 09/01/2017   Foot pain, right 11/22/2017   H/O bronchitis 08/19/2011   Managed with albuterol inhaler   H/O hiatal hernia    Health care maintenance 07/01/2013   Healthcare maintenance 07/01/2013   Hyperlipidemia    Hypertension    Hypokalemia 10/29/2012   Knee effusion, left 04/02/2018   Musculoskeletal pain 09/20/2013   Tendinopathy of right rotator cuff 10/02/2017     ROS:  Negative unless stated in section: acute sinusitis.   Assessment / Plan / Recommendations:  Please see A&P under problem oriented charting for assessment of the patient's acute and chronic medical conditions.  As always, pt is advised that if symptoms worsen or new symptoms arise, they should go to an urgent care facility or to to ER for further evaluation.   Consent and Medical Decision Making:  Patient discussed with Dr. Cleda Daub This is a telephone encounter between Caitlyn Ingram and Gwenevere Abbot on 02/17/2023 for bacterial sinusitis. The visit was conducted with the patient located at home and Gwenevere Abbot at [facility]. The patient's identity was confirmed using their DOB and current address. The patient has consented to being evaluated through a telephone encounter and understands the associated risks (an examination cannot be done and the patient may need to come in for an appointment) / benefits (allows the patient to remain at home, decreasing exposure to coronavirus). I personally spent 17 minutes on medical discussion.     Acute sinusitis Pt with complaint of sinus congestion, headache, with frequent nasal drainage and sore throat that  has been present for 14 days. She is also reporting chills. Given the duration, suspect she has developed bacterial sinusitis. 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 Will treat her with Augmentin for 5 days given high likelihood this can be due to hemophilus influenza given her work which has known resistance to amoxicillin. Advised to continue Zyrtec, Flonase and neti-pot given her hx of allergic rhinitis to minimize her symptoms. Pt given return precautions.

## 2023-02-20 NOTE — Progress Notes (Signed)
Internal Medicine Clinic Attending  I was physically present during the key portions of the resident provided service and participated in the medical decision making of patient's management care. I reviewed pertinent patient test results.  The assessment, diagnosis, and plan were formulated together and I agree with the documentation in the resident's note.  Earl Lagos, MD

## 2023-02-21 NOTE — Progress Notes (Signed)
Internal Medicine Clinic Attending  Case discussed with the resident at the time of the visit.  We reviewed the resident's history and exam and pertinent patient test results.  I agree with the assessment, diagnosis, and plan of care documented in the resident's note.  

## 2023-02-28 ENCOUNTER — Encounter: Payer: Medicare Other | Admitting: Student

## 2023-03-10 DIAGNOSIS — H10011 Acute follicular conjunctivitis, right eye: Secondary | ICD-10-CM | POA: Diagnosis not present

## 2023-03-14 DIAGNOSIS — H5789 Other specified disorders of eye and adnexa: Secondary | ICD-10-CM | POA: Diagnosis not present

## 2023-03-22 ENCOUNTER — Ambulatory Visit: Payer: Medicare Other | Admitting: Student

## 2023-03-22 ENCOUNTER — Encounter: Payer: Self-pay | Admitting: Student

## 2023-03-22 VITALS — BP 177/94 | HR 96 | Temp 98.6°F | Ht 64.0 in | Wt 168.5 lb

## 2023-03-22 DIAGNOSIS — E1169 Type 2 diabetes mellitus with other specified complication: Secondary | ICD-10-CM

## 2023-03-22 DIAGNOSIS — E119 Type 2 diabetes mellitus without complications: Secondary | ICD-10-CM | POA: Diagnosis not present

## 2023-03-22 DIAGNOSIS — I10 Essential (primary) hypertension: Secondary | ICD-10-CM

## 2023-03-22 DIAGNOSIS — E785 Hyperlipidemia, unspecified: Secondary | ICD-10-CM | POA: Diagnosis not present

## 2023-03-22 DIAGNOSIS — F17210 Nicotine dependence, cigarettes, uncomplicated: Secondary | ICD-10-CM

## 2023-03-22 LAB — GLUCOSE, CAPILLARY: Glucose-Capillary: 88 mg/dL (ref 70–99)

## 2023-03-22 LAB — POCT GLYCOSYLATED HEMOGLOBIN (HGB A1C): Hemoglobin A1C: 6.6 % — AB (ref 4.0–5.6)

## 2023-03-22 NOTE — Patient Instructions (Addendum)
Thank you so much for coming to the clinic today!         Flagyl [metronidazole Hcl] High Intolerance Nausea And Vomiting, Other (See Comments) Headaches  Benadryl [diphenhydramine Hcl] Not Specified Allergy Other (See Comments) syncope  Hydrocodone-acetaminophen Not Specified   REACTION: hives and vomitting  Iodine Not Specified   REACTION: hives and vomitting  Lisinopril Not Specified  Cough    The above are your allergies. I will give you a call to talk about your lab results whenever they come back. Please bring your blood pressure cuff in at your next visit for a blood pressure check.    If you have any questions please feel free to the call the clinic at anytime at [phone]. It was a pleasure seeing you!  Best, Dr. Thomasene Ripple

## 2023-03-23 LAB — BMP8+ANION GAP
Anion Gap: 15 mmol/L (ref 10.0–18.0)
BUN/Creatinine Ratio: 19 (ref 12–28)
BUN: 11 mg/dL (ref 8–27)
CO2: 24 mmol/L (ref 20–29)
Calcium: 9.4 mg/dL (ref 8.7–10.3)
Chloride: 103 mmol/L (ref 96–106)
Creatinine, Ser: 0.57 mg/dL (ref 0.57–1.00)
Glucose: 86 mg/dL (ref 70–99)
Potassium: 3.5 mmol/L (ref 3.5–5.2)
Sodium: 142 mmol/L (ref 134–144)
eGFR: 99 mL/min/{1.73_m2} (ref >59)

## 2023-03-23 LAB — LIPID PANEL
Chol/HDL Ratio: 2.6 ratio (ref 0.0–4.4)
Cholesterol, Total: 156 mg/dL (ref 100–199)
HDL: 59 mg/dL (ref >39)
LDL Chol Calc (NIH): 77 mg/dL (ref 0–99)
Triglycerides: 115 mg/dL (ref 0–149)
VLDL Cholesterol Cal: 20 mg/dL (ref 5–40)

## 2023-03-23 NOTE — Assessment & Plan Note (Signed)
A1c around the same as from November 2023.  At that time was 6.5, now at 6.6.  Currently not taking any medications for this, however is working on weight loss and eating better.  Will continue to hold off medications.  Will also check a urine microalbumin today.

## 2023-03-23 NOTE — Progress Notes (Signed)
CC: Blood pressure follow-up  HPI:  [name] is a 67 y.o. female living with a history stated below and presents today for pressure follow-up. Please see problem based assessment and plan for additional details.  Past Medical History:  Diagnosis Date   Abnormal urine finding 09/20/2013   Allergic urticaria 04/10/2015   Allergy    Cough 10/14/2016   Folliculitis 09/01/2017   Foot pain, right 11/22/2017   H/O bronchitis 08/19/2011   Managed with albuterol inhaler   H/O hiatal hernia    Health care maintenance 07/01/2013   Healthcare maintenance 07/01/2013   Hyperlipidemia    Hypertension    Hypokalemia 10/29/2012   Knee effusion, left 04/02/2018   Musculoskeletal pain 09/20/2013   Tendinopathy of right rotator cuff 10/02/2017    Current Outpatient Medications on File Prior to Visit  Medication Sig Dispense Refill   albuterol (PROAIR HFA) 108 (90 Base) MCG/ACT inhaler Inhale 1-2 puffs into the lungs every 6 (six) hours as needed for wheezing or shortness of breath. 18 g 1   amLODipine-olmesartan (AZOR) 5-40 MG tablet Take 1 tablet by mouth daily. 30 tablet 1   diclofenac Sodium (VOLTAREN ARTHRITIS PAIN) 1 % GEL Apply 4 g topically 4 (four) times daily. 150 g 3   ibuprofen (ADVIL) 400 MG tablet TAKE 1/2 (ONE-HALF) TABLET BY MOUTH EVERY 8 HOURS AS NEEDED 90 tablet 0   rosuvastatin (CRESTOR) 20 MG tablet Take 1 tablet (20 mg total) by mouth daily. 90 tablet 2   No current facility-administered medications on file prior to visit.    Family History  Problem Relation Age of Onset   Cancer Neg Hx    Stroke Neg Hx     Social History   Socioeconomic History   Marital status: Legally Separated    Spouse name: Not on file   Number of children: Not on file   Years of education: Not on file   Highest education level: Not on file  Occupational History   Not on file  Tobacco Use   Smoking status: Every Day    Current packs/day: 0.10    Types: Cigarettes   Smokeless tobacco:  Never   Tobacco comments:    Cutting back. 5  per day  Substance and Sexual Activity   Alcohol use: Yes    Alcohol/week: 0.0 standard drinks of alcohol    Comment: Rarely.   Drug use: No   Sexual activity: Not on file  Other Topics Concern   Not on file  Social History Narrative   Not on file   Social Determinants of Health   Financial Resource Strain: Low Risk  (01/18/2022)   Overall Financial Resource Strain (CARDIA)    Difficulty of Paying Living Expenses: Not hard at all  Food Insecurity: No Food Insecurity (01/18/2022)   Hunger Vital Sign    Worried About Running Out of Food in the Last Year: Never true    Ran Out of Food in the Last Year: Never true  Transportation Needs: No Transportation Needs (01/18/2022)   PRAPARE - Administrator, Civil Service (Medical): No    Lack of Transportation (Non-Medical): No  Physical Activity: Unknown (01/18/2022)   Exercise Vital Sign    Days of Exercise per Week: 7 days    Minutes of Exercise per Session: Not on file  Stress: No Stress Concern Present (01/18/2022)   [employer] of Occupational Health - Occupational Stress Questionnaire    Feeling of Stress : Not at all  Social Connections: Moderately Integrated (01/18/2022)   Social Connection and Isolation Panel [NHANES]    Frequency of Communication with Friends and Family: More than three times a week    Frequency of Social Gatherings with Friends and Family: More than three times a week    Attends Religious Services: More than 4 times per year    Active Member of [employer] or Organizations: Yes    Attends Banker Meetings: More than 4 times per year    Marital Status: Widowed  Intimate Partner Violence: Not At Risk (01/18/2022)   Humiliation, Afraid, Rape, and Kick questionnaire    Fear of Current or Ex-Partner: No    Emotionally Abused: No    Physically Abused: No    Sexually Abused: No    Review of Systems: ROS negative except for what is noted on the  assessment and plan.  Vitals:   03/22/23 1548 03/22/23 1623  BP: (!) 183/86 (!) 177/94  Pulse: 95 96  Temp: 98.6 F (37 C)   TempSrc: Oral   SpO2: 100%   Weight: 168 lb 8 oz (76.4 kg)   Height: 5\' 4"  (1.626 m)     Physical Exam: Constitutional: well-appearing female in no acute distress Cardiovascular: regular rate and rhythm, no m/r/g Pulmonary/Chest: normal work of breathing on room air, lungs clear to auscultation bilaterally Neurological: alert & oriented x 3, 5/5 strength in bilateral upper and lower extremities, normal sensation in bilateral feet, normal gait   Assessment & Plan:   Essential hypertension Patient presents for blood pressure follow-up.  Her current regimen is amlodipine olmesartan combination pill 5-40.  Blood pressure in the clinic today elevated at 183/86, and then on recheck was 177/94.  She does have a blood pressure cuff at home for which she states her systolics are usually in the 120s to 130 range, and she feels like she has whitecoat syndrome where her blood pressure rises significantly only when she is here.  Per chart review she has a history of high blood pressure especially in the clinic, she has noted this before.  She does have risk factors such as continued cigarette use (discussed on separate problem)  Encouraged patient to bring in actual blood pressure log, she did not at this time.  Encourage patient to set up MyChart and also send me a picture of the log.  Will follow-up with the patient in a month for a blood pressure nursing visit, encouraged patient to bring in her blood pressure cuff as this may not be reading correctly.  Plan: -Continue amlodipine-olmesartan 5-40 mg daily - Bring in blood pressure log/send to me via MyChart - Bring blood pressure cuff and to next visit  Hyperlipidemia She has been on Crestor 20 mg since November 2023.  She states she is doing well with this medication.  Her last lipid panel showed an LDL of 90, and she  does have a history of diabetes for which a statin is indicated.  Will recheck lipid panel today and adjust medication if necessary.  DMII (diabetes mellitus, type 2) (HCC) A1c around the same as from November 2023.  At that time was 6.5, now at 6.6.  Currently not taking any medications for this, however is working on weight loss and eating better.  Will continue to hold off medications.  Will also check a urine microalbumin today.  Patient discussed with Dr. Pauline Good Cainen Burnham, M.D. [facility] Internal Medicine, PGY-2 Pager: [phone] Date 03/23/2023 Time 10:18 AM

## 2023-03-23 NOTE — Assessment & Plan Note (Signed)
Patient presents for blood pressure follow-up.  Her current regimen is amlodipine olmesartan combination pill 5-40.  Blood pressure in the clinic today elevated at 183/86, and then on recheck was 177/94.  She does have a blood pressure cuff at home for which she states her systolics are usually in the 120s to 130 range, and she feels like she has whitecoat syndrome where her blood pressure rises significantly only when she is here.  Per chart review she has a history of high blood pressure especially in the clinic, she has noted this before.  She does have risk factors such as continued cigarette use (discussed on separate problem)  Encouraged patient to bring in actual blood pressure log, she did not at this time.  Encourage patient to set up MyChart and also send me a picture of the log.  Will follow-up with the patient in a month for a blood pressure nursing visit, encouraged patient to bring in her blood pressure cuff as this may not be reading correctly.  Plan: -Continue amlodipine-olmesartan 5-40 mg daily - Bring in blood pressure log/send to me via MyChart - Bring blood pressure cuff and to next visit

## 2023-03-23 NOTE — Assessment & Plan Note (Signed)
She has been on Crestor 20 mg since November 2023.  She states she is doing well with this medication.  Her last lipid panel showed an LDL of 90, and she does have a history of diabetes for which a statin is indicated.  Will recheck lipid panel today and adjust medication if necessary.

## 2023-03-24 DIAGNOSIS — H25811 Combined forms of age-related cataract, right eye: Secondary | ICD-10-CM | POA: Diagnosis not present

## 2023-03-24 LAB — MICROALBUMIN / CREATININE URINE RATIO
Creatinine, Urine: 33.6 mg/dL
Microalb/Creat Ratio: 40 mg/g{creat} — ABNORMAL HIGH (ref 0–29)
Microalbumin, Urine: 13.6 ug/mL

## 2023-03-30 NOTE — Progress Notes (Signed)
Internal Medicine Clinic Attending  Case discussed with the resident at the time of the visit.  We reviewed the resident's history and exam and pertinent patient test results.  I agree with the assessment, diagnosis, and plan of care documented in the resident's note.  

## 2023-04-05 ENCOUNTER — Telehealth: Payer: Self-pay

## 2023-04-05 NOTE — Telephone Encounter (Signed)
Appt has been scheduled with Dr Nooruddin (Dr Mikey Bussing will be attending) Friday 11/8 @ 0845 AM for possible knee injection

## 2023-04-05 NOTE — Telephone Encounter (Signed)
Pt is requesting a call back .Caitlyn Ingram She stated that her knees are hurting her .Caitlyn Ingram And she is wanting injections in them but we are all booked up until the  end of the month , plus we dont have a schedules  for the attending  who over sees the  shots .Caitlyn Ingram She is also wanting to know  if she can go someplace else to get the shot

## 2023-04-07 ENCOUNTER — Ambulatory Visit (INDEPENDENT_AMBULATORY_CARE_PROVIDER_SITE_OTHER): Payer: Medicare Other | Admitting: Student

## 2023-04-07 VITALS — BP 176/85 | HR 97 | Temp 98.1°F | Wt 165.3 lb

## 2023-04-07 DIAGNOSIS — M170 Bilateral primary osteoarthritis of knee: Secondary | ICD-10-CM

## 2023-04-07 DIAGNOSIS — E785 Hyperlipidemia, unspecified: Secondary | ICD-10-CM | POA: Diagnosis not present

## 2023-04-07 MED ORDER — TRIAMCINOLONE ACETONIDE 40 MG/ML IJ SUSP
40.0000 mg | Freq: Once | INTRAMUSCULAR | Status: AC
  Administered 2023-04-07: 40 mg via INTRA_ARTICULAR

## 2023-04-07 MED ORDER — LIDOCAINE HCL (PF) 1 % IJ SOLN
2.0000 mL | Freq: Once | INTRAMUSCULAR | Status: AC
  Administered 2023-04-07: 2 mL

## 2023-04-07 NOTE — Patient Instructions (Signed)
Thank you so much for coming to the clinic today!   Today we injected your knees, I hope it will help with the pain.   If you have any questions please feel free to the call the clinic at anytime at [phone]. It was a pleasure seeing you!  Best, Dr. Thomasene Ripple

## 2023-04-07 NOTE — Assessment & Plan Note (Addendum)
Pt presents with follow up regarding her knee pain. She has had imagining of both knees within the last years which do show osteoarthritic changes bilaterally. She has undergone multiple injections before, most recently in May. She has tried: voltaren gel, ibuprofen, and heating pods with no relief. Her last injection was in May 2024, for which she states helped controlled her pain greatly.   Discussed risks and benefits which patient agreed to and accepted, bilateral knee injection performed in the inferior lateral aspect of both knees. No complications, pt understands to rest knees for a few days and use ice as needed.

## 2023-04-07 NOTE — Progress Notes (Unsigned)
CC: Knee pain bilaterally  HPI:  Caitlyn Ingram is a 67 y.o. female living with a history stated below and presents today for knee pain bilaterally. Please see problem based assessment and plan for additional details.  Past Medical History:  Diagnosis Date   Abnormal urine finding 09/20/2013   Allergic urticaria 04/10/2015   Allergy    Cough 10/14/2016   Folliculitis 09/01/2017   Foot pain, right 11/22/2017   H/O bronchitis 08/19/2011   Managed with albuterol inhaler   H/O hiatal hernia    Health care maintenance 07/01/2013   Healthcare maintenance 07/01/2013   Hyperlipidemia    Hypertension    Hypokalemia 10/29/2012   Knee effusion, left 04/02/2018   Musculoskeletal pain 09/20/2013   Tendinopathy of right rotator cuff 10/02/2017    Current Outpatient Medications on File Prior to Visit  Medication Sig Dispense Refill   albuterol (PROAIR HFA) 108 (90 Base) MCG/ACT inhaler Inhale 1-2 puffs into the lungs every 6 (six) hours as needed for wheezing or shortness of breath. 18 g 1   amLODipine-olmesartan (AZOR) 5-40 MG tablet Take 1 tablet by mouth daily. 30 tablet 1   diclofenac Sodium (VOLTAREN ARTHRITIS PAIN) 1 % GEL Apply 4 g topically 4 (four) times daily. 150 g 3   ibuprofen (ADVIL) 400 MG tablet TAKE 1/2 (ONE-HALF) TABLET BY MOUTH EVERY 8 HOURS AS NEEDED 90 tablet 0   rosuvastatin (CRESTOR) 20 MG tablet Take 1 tablet (20 mg total) by mouth daily. 90 tablet 2   No current facility-administered medications on file prior to visit.    Family History  Problem Relation Age of Onset   Cancer Neg Hx    Stroke Neg Hx     Social History   Socioeconomic History   Marital status: Legally Separated    Spouse name: Not on file   Number of children: Not on file   Years of education: Not on file   Highest education level: Not on file  Occupational History   Not on file  Tobacco Use   Smoking status: Every Day    Current packs/day: 0.10    Types: Cigarettes   Smokeless tobacco:  Never   Tobacco comments:    Cutting back. 5  per day  Substance and Sexual Activity   Alcohol use: Yes    Alcohol/week: 0.0 standard drinks of alcohol    Comment: Rarely.   Drug use: No   Sexual activity: Not on file  Other Topics Concern   Not on file  Social History Narrative   Not on file   Social Determinants of Health   Financial Resource Strain: Low Risk  (01/18/2022)   Overall Financial Resource Strain (CARDIA)    Difficulty of Paying Living Expenses: Not hard at all  Food Insecurity: No Food Insecurity (01/18/2022)   Hunger Vital Sign    Worried About Running Out of Food in the Last Year: Never true    Ran Out of Food in the Last Year: Never true  Transportation Needs: No Transportation Needs (01/18/2022)   PRAPARE - Administrator, Civil Service (Medical): No    Lack of Transportation (Non-Medical): No  Physical Activity: Unknown (01/18/2022)   Exercise Vital Sign    Days of Exercise per Week: 7 days    Minutes of Exercise per Session: Not on file  Stress: No Stress Concern Present (01/18/2022)   [employer] of Occupational Health - Occupational Stress Questionnaire    Feeling of Stress : Not at  all  Social Connections: Moderately Integrated (01/18/2022)   Social Connection and Isolation Panel [NHANES]    Frequency of Communication with Friends and Family: More than three times a week    Frequency of Social Gatherings with Friends and Family: More than three times a week    Attends Religious Services: More than 4 times per year    Active Member of [employer] or Organizations: Yes    Attends Banker Meetings: More than 4 times per year    Marital Status: Widowed  Intimate Partner Violence: Not At Risk (01/18/2022)   Humiliation, Afraid, Rape, and Kick questionnaire    Fear of Current or Ex-Partner: No    Emotionally Abused: No    Physically Abused: No    Sexually Abused: No    Review of Systems: ROS negative except for what is noted on the  assessment and plan.  Vitals:   04/07/23 0900 04/07/23 0947  BP: (!) 176/85 (!) 176/85  Pulse: 97   Temp: 98.1 F (36.7 C)   TempSrc: Oral   SpO2: 100%   Weight: 165 lb 4.8 oz (75 kg)     Physical Exam: Constitutional: well-appearing female  in no acute distress Cardiovascular: regular rate and rhythm, no m/r/g Pulmonary/Chest: normal work of breathing on room air, lungs clear to auscultation bilaterally MSK: Tenderness to palpation in patella area bilaterally, limited range of motion in both knees    PROCEDURE NOTE  PROCEDURE: Bilateral knee joint steroid injection.  PREOPERATIVE DIAGNOSIS: Osteoarthritis of the right and left knee.  POSTOPERATIVE DIAGNOSIS: Osteoarthritis of the right and left knee.  PROCEDURE: The patient was apprised of the risks and the benefits of the procedure and informed consent was obtained, as witnessed by Dr. Mikey Bussing and Dr. Thomasene Ripple. Time-out procedure was performed, with confirmation of the patient's name, date of birth, and correct identification of the bilateral knee to be injected. The patient's knee was then marked at the appropriate site for injection placement. The knee was sterilely prepped with Betadine. A 40 mg (5 milliliter) solution of Kenalog was drawn up into a 5 mL syringe with a 2 mL of 1% lidocaine. The patient was injected with a 25-gauge needle at the lateral inferior aspect of both flexed knees. There were no complications. The patient tolerated the procedure well. There was minimal bleeding. The patient was instructed to ice her knee upon leaving clinic and refrain from overuse over the next 3 days. The patient was instructed to go to the emergency room with any usual pain, swelling, or redness occurred in the injected area. The patient was given a followup appointment to evaluate response to the injection to his increased range of motion and reduction of pain.  The procedure was supervised by attending physician, Dr. Mikey Bussing.    Assessment & Plan:   Osteoarthritis of knees, bilateral Pt presents with follow up regarding her knee pain. She has had imagining of both knees within the last years which do show osteoarthritic changes bilaterally. She has undergone multiple injections before, most recently in May. She has tried: voltaren gel, ibuprofen, and heating pods with no relief. Her last injection was in May 2024, for which she states helped controlled her pain greatly.   Discussed risks and benefits which patient agreed to and accepted, bilateral knee injection performed in the inferior lateral aspect of both knees. No complications, pt understands to rest knees for a few days and use ice as needed.   Patient discussed with Dr. Baird Lyons  Annella Prowell, M.D. [facility] Internal Medicine, PGY-2 Pager: [phone] Date 04/07/2023 Time 10:23 AM

## 2023-04-08 NOTE — Addendum Note (Signed)
Addended by: Olegario Messier on: 04/08/2023 11:20 AM   Modules accepted: Level of Service

## 2023-04-13 NOTE — Progress Notes (Signed)
Internal Medicine Clinic Attending  I was physically present during the key portions of the resident provided service and participated in the medical decision making of patient's management care. I reviewed pertinent patient test results.  The assessment, diagnosis, and plan were formulated together and I agree with the documentation in the resident's note.  Gust Rung, DO

## 2023-04-25 ENCOUNTER — Other Ambulatory Visit: Payer: Self-pay | Admitting: Internal Medicine

## 2023-04-25 DIAGNOSIS — I10 Essential (primary) hypertension: Secondary | ICD-10-CM

## 2023-05-01 ENCOUNTER — Other Ambulatory Visit: Payer: Self-pay

## 2023-05-01 MED ORDER — IBUPROFEN 400 MG PO TABS: ORAL_TABLET | ORAL | 0 refills | Status: DC

## 2023-05-26 ENCOUNTER — Other Ambulatory Visit: Payer: Self-pay | Admitting: Internal Medicine

## 2023-05-26 DIAGNOSIS — I10 Essential (primary) hypertension: Secondary | ICD-10-CM

## 2023-05-26 NOTE — Telephone Encounter (Signed)
Medication sent to pharmacy  

## 2023-06-02 DIAGNOSIS — H2512 Age-related nuclear cataract, left eye: Secondary | ICD-10-CM | POA: Diagnosis not present

## 2023-06-06 DIAGNOSIS — H25812 Combined forms of age-related cataract, left eye: Secondary | ICD-10-CM | POA: Diagnosis not present

## 2023-06-29 ENCOUNTER — Other Ambulatory Visit: Payer: Self-pay | Admitting: Internal Medicine

## 2023-06-29 DIAGNOSIS — I10 Essential (primary) hypertension: Secondary | ICD-10-CM

## 2023-06-29 NOTE — Telephone Encounter (Signed)
Medication sent to pharmacy

## 2023-08-01 ENCOUNTER — Encounter: Payer: Medicare Other | Admitting: Internal Medicine

## 2023-08-23 ENCOUNTER — Ambulatory Visit: Admitting: Student

## 2023-08-23 VITALS — BP 190/89 | HR 100 | Temp 97.8°F | Ht 64.0 in | Wt 164.2 lb

## 2023-08-23 DIAGNOSIS — E1169 Type 2 diabetes mellitus with other specified complication: Secondary | ICD-10-CM

## 2023-08-23 DIAGNOSIS — M170 Bilateral primary osteoarthritis of knee: Secondary | ICD-10-CM | POA: Diagnosis not present

## 2023-08-23 DIAGNOSIS — I10 Essential (primary) hypertension: Secondary | ICD-10-CM | POA: Diagnosis not present

## 2023-08-23 DIAGNOSIS — Z0000 Encounter for general adult medical examination without abnormal findings: Secondary | ICD-10-CM

## 2023-08-23 DIAGNOSIS — Z720 Tobacco use: Secondary | ICD-10-CM

## 2023-08-23 LAB — POCT GLYCOSYLATED HEMOGLOBIN (HGB A1C): Hemoglobin A1C: 6.3 % — AB (ref 4.0–5.6)

## 2023-08-23 LAB — GLUCOSE, CAPILLARY: Glucose-Capillary: 106 mg/dL — ABNORMAL HIGH (ref 70–99)

## 2023-08-23 MED ORDER — NICOTINE POLACRILEX 2 MG MT GUM: 2.0000 mg | CHEWING_GUM | OROMUCOSAL | 0 refills | Status: AC | PRN

## 2023-08-23 MED ORDER — NICOTINE 14 MG/24HR TD PT24: 14.0000 mg | MEDICATED_PATCH | Freq: Every day | TRANSDERMAL | 2 refills | Status: AC

## 2023-08-23 MED ORDER — AMLODIPINE-OLMESARTAN 5-40 MG PO TABS: 1.0000 | ORAL_TABLET | Freq: Every day | ORAL | 3 refills | Status: DC

## 2023-08-23 NOTE — Patient Instructions (Signed)
 Thank you so much for coming to the clinic today!   Please bring in your blood pressure cuff at your next appointment!  If you have any questions please feel free to the call the clinic at anytime at [phone]. It was a pleasure seeing you!  Best, Dr. Thomasene Ripple

## 2023-08-24 DIAGNOSIS — M170 Bilateral primary osteoarthritis of knee: Secondary | ICD-10-CM | POA: Diagnosis not present

## 2023-08-24 MED ORDER — TRIAMCINOLONE ACETONIDE 40 MG/ML IJ SUSP
1.0000 mg | Freq: Once | INTRAMUSCULAR | Status: AC
  Administered 2023-08-24: 1.2 mg via INTRA_ARTICULAR

## 2023-08-24 MED ORDER — LIDOCAINE HCL (PF) 1 % IJ SOLN
2.0000 mL | Freq: Once | INTRAMUSCULAR | Status: AC
  Administered 2023-08-24: 2 mL via INTRADERMAL

## 2023-08-24 NOTE — Progress Notes (Addendum)
 CC: Knee pain  HPI:  Caitlyn Ingram is a 68 y.o. female living with a history stated below and presents today for knee pain and high blood pressure. Please see problem based assessment and plan for additional details.  Past Medical History:  Diagnosis Date   Abnormal urine finding 09/20/2013   Allergic urticaria 04/10/2015   Allergy    Cough 10/14/2016   Folliculitis 09/01/2017   Foot pain, right 11/22/2017   H/O bronchitis 08/19/2011   Managed with albuterol inhaler   H/O hiatal hernia    Health care maintenance 07/01/2013   Healthcare maintenance 07/01/2013   Hyperlipidemia    Hypertension    Hypokalemia 10/29/2012   Knee effusion, left 04/02/2018   Musculoskeletal pain 09/20/2013   Tendinopathy of right rotator cuff 10/02/2017    Current Outpatient Medications on File Prior to Visit  Medication Sig Dispense Refill   olmesartan (BENICAR) 40 MG tablet Take 1 tablet by mouth once daily 30 tablet 3   albuterol (PROAIR HFA) 108 (90 Base) MCG/ACT inhaler Inhale 1-2 puffs into the lungs every 6 (six) hours as needed for wheezing or shortness of breath. 18 g 1   diclofenac Sodium (VOLTAREN ARTHRITIS PAIN) 1 % GEL Apply 4 g topically 4 (four) times daily. 150 g 3   ibuprofen (ADVIL) 400 MG tablet TAKE 1/2 (ONE-HALF) TABLET BY MOUTH EVERY 8 HOURS AS NEEDED 90 tablet 0   rosuvastatin (CRESTOR) 20 MG tablet Take 1 tablet (20 mg total) by mouth daily. 90 tablet 2   No current facility-administered medications on file prior to visit.    Family History  Problem Relation Age of Onset   Cancer Neg Hx    Stroke Neg Hx     Social History   Socioeconomic History   Marital status: Legally Separated    Spouse name: Not on file   Number of children: Not on file   Years of education: Not on file   Highest education level: Not on file  Occupational History   Not on file  Tobacco Use   Smoking status: Every Day    Current packs/day: 0.10    Types: Cigarettes   Smokeless tobacco: Never    Tobacco comments:    Cutting back. 5  per day  Substance and Sexual Activity   Alcohol use: Yes    Alcohol/week: 0.0 standard drinks of alcohol    Comment: Rarely.   Drug use: No   Sexual activity: Not on file  Other Topics Concern   Not on file  Social History Narrative   Not on file   Social Drivers of Health   Financial Resource Strain: Low Risk  (01/18/2022)   Overall Financial Resource Strain (CARDIA)    Difficulty of Paying Living Expenses: Not hard at all  Food Insecurity: No Food Insecurity (01/18/2022)   Hunger Vital Sign    Worried About Running Out of Food in the Last Year: Never true    Ran Out of Food in the Last Year: Never true  Transportation Needs: No Transportation Needs (01/18/2022)   PRAPARE - Administrator, Civil Service (Medical): No    Lack of Transportation (Non-Medical): No  Physical Activity: Unknown (01/18/2022)   Exercise Vital Sign    Days of Exercise per Week: 7 days    Minutes of Exercise per Session: Not on file  Stress: No Stress Concern Present (01/18/2022)   [employer] of Occupational Health - Occupational Stress Questionnaire    Feeling of Stress :  Not at all  Social Connections: Moderately Integrated (01/18/2022)   Social Connection and Isolation Panel [NHANES]    Frequency of Communication with Friends and Family: More than three times a week    Frequency of Social Gatherings with Friends and Family: More than three times a week    Attends Religious Services: More than 4 times per year    Active Member of [employer] or Organizations: Yes    Attends Banker Meetings: More than 4 times per year    Marital Status: Widowed  Intimate Partner Violence: Not At Risk (01/18/2022)   Humiliation, Afraid, Rape, and Kick questionnaire    Fear of Current or Ex-Partner: No    Emotionally Abused: No    Physically Abused: No    Sexually Abused: No    Review of Systems: ROS negative except for what is noted on the assessment  and plan.  Vitals:   08/23/23 1552 08/23/23 1649  BP: (!) 184/114 (!) 190/89  Pulse: 100 100  Temp: 97.8 F (36.6 C)   TempSrc: Oral   SpO2: 100%   Weight: 164 lb 3.2 oz (74.5 kg)   Height: 5\' 4"  (1.626 m)     Physical Exam: Constitutional: well-appearing female  in no acute distress HENT: normocephalic atraumatic, mucous membranes moist Eyes: conjunctiva non-erythematous Neck: supple Cardiovascular: regular rate and rhythm, no m/r/g Pulmonary/Chest: normal work of breathing on room air, lungs clear to auscultation bilaterally Abdominal: soft, non-tender, non-distended MSK: normal bulk and tone Neurological: alert & oriented x 3, 5/5 strength in bilateral upper and lower extremities, normal gait   PROCEDURE NOTE  PROCEDURE: right knee joint steroid injection.  PREOPERATIVE DIAGNOSIS: Osteoarthritis of the right knee.  POSTOPERATIVE DIAGNOSIS: Osteoarthritis of the right knee.  PROCEDURE: The patient was apprised of the risks and the benefits of the procedure and informed consent was obtained, as witnessed by Dr. Sol Blazing. Time-out procedure was performed, with confirmation of the patient's name, date of birth, and correct identification of the right knee to be injected. The patient's knee was then marked at the appropriate site for injection placement. The knee was sterilely prepped with Betadine. A 40 mg (1 milliliter) solution of Kenalog was drawn up into a 5 mL syringe with a 2 mL of 1% lidocaine. The patient was injected with a 21-gauge needle at the inferior-lateral aspect of her right flexed knee. There were no complications. The patient tolerated the procedure well. There was minimal bleeding. The patient was instructed to ice her knee upon leaving clinic and refrain from overuse over the next 3 days. The patient was instructed to go to the emergency room with any usual pain, swelling, or redness occurred in the injected area. The patient was given a followup appointment to  evaluate response to the injection to his increased range of motion and reduction of pain.  The procedure was supervised by attending physician, Dr. Sol Blazing.   PROCEDURE NOTE  PROCEDURE: left knee joint steroid injection.  PREOPERATIVE DIAGNOSIS: Osteoarthritis of the left knee.  POSTOPERATIVE DIAGNOSIS: Osteoarthritis of the left knee.  PROCEDURE: The patient was apprised of the risks and the benefits of the procedure and informed consent was obtained, as witnessed by Dr. Sol Blazing. Time-out procedure was performed, with confirmation of the patient's name, date of birth, and correct identification of the left knee to be injected. The patient's knee was then marked at the appropriate site for injection placement. The knee was sterilely prepped with Betadine. A 40 mg (1 milliliter) solution of Kenalog  was drawn up into a 5 mL syringe with a 2 mL of 1% lidocaine. The patient was injected with a 21-gauge needle at the inferior-lateral aspect of her left flexed knee. There were no complications. The patient tolerated the procedure well. There was minimal bleeding. The patient was instructed to ice her knee upon leaving clinic and refrain from overuse over the next 3 days. The patient was instructed to go to the emergency room with any usual pain, swelling, or redness occurred in the injected area. The patient was given a followup appointment to evaluate response to the injection to his increased range of motion and reduction of pain.  The procedure was supervised by attending physician, Dr. Sol Blazing.    Assessment & Plan:   Essential hypertension Patient presents for follow-up regarding her blood pressure.  Today it is elevated initially at 184/114, and then on repeat 190/89.  Her regimen is azor 5/40, which she did take this morning.  She does check her blood pressure frequently at home, she reports it is in the 120s to 130 range after she takes her medications.  I have advised her to bring her blood pressure cuff  and before, however she has not.  She is amenable to bring in her values at her next visit so we can titrate as necessary.  I think she may have whitecoat hypertension, although she does have other risk factors for hypertension such as tobacco use.  Plan: - Continue amlodipine olmesartan 5/40 daily - Bring blood pressure log at next visit - Bring blood pressure cuff to next visit  DMII (diabetes mellitus, type 2) (HCC) A1c has improved from 6.6-6.3.  She is not on any medications for diabetes, managed with weight loss and better nutrition.  Osteoarthritis of knees, bilateral Patient with longstanding history of bilateral knee osteoarthritis, she has had multiple injections before for her knees, most recently in November 2024.  I have discussed the risk and benefits which patient agreed to and accepted, consent form has also been signed.  No complications.  Tobacco abuse Patient is still smoking about 2 to 3 cigarettes a day, down from 4 cigarettes a day.  She is interested in quitting, but she knows that this is bad for her wellbeing.  Will send in nicotine patches for her as well as Nicorette gum.   Patient discussed with Dr. Lennon Alstrom Cabot Cromartie, M.D. [facility] Medicine, PGY-2 Pager: [phone] Date 08/24/2023 Time 8:45 AM

## 2023-08-24 NOTE — Assessment & Plan Note (Signed)
 Patient is still smoking about 2 to 3 cigarettes a day, down from 4 cigarettes a day.  She is interested in quitting, but she knows that this is bad for her wellbeing.  Will send in nicotine patches for her as well as Nicorette gum.

## 2023-08-24 NOTE — Assessment & Plan Note (Signed)
 Patient with longstanding history of bilateral knee osteoarthritis, she has had multiple injections before for her knees, most recently in November 2024.  I have discussed the risk and benefits which patient agreed to and accepted, consent form has also been signed.  No complications.

## 2023-08-24 NOTE — Addendum Note (Signed)
 Addended by: Dickie La on: 08/24/2023 10:49 AM   Modules accepted: Level of Service

## 2023-08-24 NOTE — Assessment & Plan Note (Signed)
 Patient presents for follow-up regarding her blood pressure.  Today it is elevated initially at 184/114, and then on repeat 190/89.  Her regimen is azor 5/40, which she did take this morning.  She does check her blood pressure frequently at home, she reports it is in the 120s to 130 range after she takes her medications.  I have advised her to bring her blood pressure cuff and before, however she has not.  She is amenable to bring in her values at her next visit so we can titrate as necessary.  I think she may have whitecoat hypertension, although she does have other risk factors for hypertension such as tobacco use.  Plan: - Continue amlodipine olmesartan 5/40 daily - Bring blood pressure log at next visit - Bring blood pressure cuff to next visit

## 2023-08-24 NOTE — Progress Notes (Signed)
 Internal Medicine Clinic Attending  Case discussed with the resident at the time of the visit.  We reviewed the resident's history and exam and pertinent patient test results.  I agree with the assessment, diagnosis, and plan of care documented in the resident's note.  I was present for the entirety of the documented procedures. As a correction to the orders, administered Kenalog was 40 mg (1mL) and lidocaine administration was intra-articular (not intradermal as noted).

## 2023-08-24 NOTE — Assessment & Plan Note (Signed)
 A1c has improved from 6.6-6.3.  She is not on any medications for diabetes, managed with weight loss and better nutrition.

## 2023-09-02 ENCOUNTER — Ambulatory Visit
Admission: RE | Admit: 2023-09-02 | Discharge: 2023-09-02 | Disposition: A | Discharge status: Home / Self Care | Source: Ambulatory Visit | Attending: Internal Medicine | Admitting: Internal Medicine

## 2023-09-02 ENCOUNTER — Other Ambulatory Visit

## 2023-09-02 DIAGNOSIS — Z0000 Encounter for general adult medical examination without abnormal findings: Secondary | ICD-10-CM

## 2023-09-02 DIAGNOSIS — M810 Age-related osteoporosis without current pathological fracture: Secondary | ICD-10-CM | POA: Diagnosis not present

## 2023-09-20 ENCOUNTER — Ambulatory Visit: Payer: Self-pay

## 2023-09-20 NOTE — Telephone Encounter (Signed)
 Reason for Triage: Pt Caitlyn Ingram has questions regarding the medication: olmesartan  (BENICAR ) 40 MG tablet , she believes it may be making her hair fall out.    Chief Complaint: hair loss Symptoms: thinning, hair falling out Disposition: [] ED /[] Urgent Care (no appt availability in office) / [] Appointment(In office/virtual)/ []  [facility]/ [] Home Care/ [] Refused Recommended Disposition /[] [place] [facility]/ [x]  Follow-up with PCP Additional Notes: Pt calling with concerns of hair falling out/thinning around edges and back. Pt has noticed this over last 2 months. Pt feels starting the medication Olmesartan  is the cause. Pt wants to cut mediation is half, but has not. Pt wants to know if dosage can be lowered or if another mediation can be prescribed.  Pt is requesting a call back and didn't want to make appt for this. RN gave care advice and pt verbalized understanding. Please advise.        Reason for Disposition  [1] Patch of hair loss AND [2] cause not known  Answer Assessment - Initial Assessment Questions 1. LOCATION: "Where is the hair loss?" (e.g., all of scalp, parts of scalp, back of head or neck)     Edges/back is falling out 2. DESCRIPTION: "Please describe it.? (e.g., thinning of hair, balding, patches of hair missing)     Thinning and clumps coming 3. ONSET: "When did the hair loss begin?" (e.g., sudden or gradual onset; days, weeks, months or years ago)     2 moths ago  4. OTHER SYMPTOMS: "What does the scalp look like where the hair is missing?" (e.g., normal, redness, crusts, scarring)     normal 5. OTHER FACTORS: "Have you had any of the following recently: childbirth, severe illness or injury, major surgery, major weight loss, cancer chemo, tight hair braids, serious stress?"     denies 6. CAUSE: "What do you think is causing the hair loss?"     Starting Benicar   Protocols used: Hair Loss-A-AH

## 2023-09-21 NOTE — Telephone Encounter (Signed)
 RTC to patient.  Patient unavailable.  Message was left with female to let patient know that the Clinics had returned her call.

## 2023-11-05 ENCOUNTER — Other Ambulatory Visit: Payer: Self-pay | Admitting: Internal Medicine

## 2023-11-05 DIAGNOSIS — I10 Essential (primary) hypertension: Secondary | ICD-10-CM

## 2023-11-06 NOTE — Telephone Encounter (Signed)
 Medication sent to pharmacy

## 2023-11-08 ENCOUNTER — Ambulatory Visit: Payer: Self-pay

## 2023-11-08 NOTE — Telephone Encounter (Signed)
 FYI Only or Action Required?: FYI only for provider  Patient was last seen in primary care on 08/23/2023 by Nooruddin, Saad, MD. Called Nurse Triage reporting Knee Pain. Symptoms began several weeks ago. Interventions attempted: OTC medications: icyhot. Symptoms are: gradually worsening.  Triage Disposition: See PCP When Office is Open (Within 3 Days)  Patient/caregiver understands and will follow disposition?: Yes, will follow disposition  Copied from CRM [id number]. Topic: Clinical - Red Word Triage >> Nov 08, 2023  3:57 PM Brittney F wrote: Red Word that prompted transfer to Nurse Triage:   Patient is experiencing pain in both legs; Patient's pain level is a 8 Reason for Disposition  [1] MODERATE pain (e.g., interferes with normal activities, limping) AND [2] present > 3 days  Answer Assessment - Initial Assessment Questions 1. LOCATION and RADIATION: Where is the pain located?      Bilateral knee pain 3. SEVERITY: How bad is the pain? What does it keep you from doing?   (Scale 1-10; or mild, moderate, severe)   -  MILD (1-3): doesn't interfere with normal activities    -  MODERATE (4-7): interferes with normal activities (e.g., work or school) or awakens from sleep, limping    -  SEVERE (8-10): excruciating pain, unable to do any normal activities, unable to walk     8 4. ONSET: When did the pain start? Does it come and go, or is it there all the time?     Ongoing, states is better after her injections, also states that icyhot also helps but only temporarily 5. RECURRENT: Have you had this pain before? If Yes, ask: When, and what happened then?     Yes, osteoarthritis 8. ASSOCIATED SYMPTOMS: Is there any swelling or redness of the knee?     denies 9. OTHER SYMPTOMS: Do you have any other symptoms? (e.g., chest pain, difficulty breathing, fever, calf pain)     denies  Pt would like injections at this appt  Protocols used: Knee Pain-A-AH

## 2023-11-08 NOTE — Telephone Encounter (Signed)
 Pt has an appt 6/16; per the schedule Dr Adriane Albe is scheduled to be attending.

## 2023-11-13 ENCOUNTER — Ambulatory Visit: Payer: Self-pay | Admitting: Internal Medicine

## 2023-11-13 VITALS — BP 188/93 | HR 73 | Temp 98.3°F | Ht 64.0 in | Wt 162.3 lb

## 2023-11-13 DIAGNOSIS — E559 Vitamin D deficiency, unspecified: Secondary | ICD-10-CM | POA: Diagnosis not present

## 2023-11-13 DIAGNOSIS — E119 Type 2 diabetes mellitus without complications: Secondary | ICD-10-CM

## 2023-11-13 DIAGNOSIS — E1169 Type 2 diabetes mellitus with other specified complication: Secondary | ICD-10-CM

## 2023-11-13 DIAGNOSIS — M170 Bilateral primary osteoarthritis of knee: Secondary | ICD-10-CM | POA: Diagnosis not present

## 2023-11-13 DIAGNOSIS — M8580 Other specified disorders of bone density and structure, unspecified site: Secondary | ICD-10-CM

## 2023-11-13 NOTE — Assessment & Plan Note (Addendum)
 Last xray in 2023 with mild tricompartmental OA of left and mild patellofemoral OA of right. She reports good relief in pain with injections in the past and that symptoms were relieved for more than 6 months. This time she only had relief for one month. A1c is well controlled. PROCEDURE NOTE  PROCEDURE: bilateral knee joint steroid injection.  PREOPERATIVE DIAGNOSIS: Osteoarthritis of the bilateral knee.  POSTOPERATIVE DIAGNOSIS: Osteoarthritis of the bilateral knee.  PROCEDURE: The patient was apprised of the risks and the benefits of the procedure and informed consent was obtained. Time-out procedure was performed, with confirmation of the patient's name, date of birth, and correct identification of the bilateral knee to be injected. The patient's knee was then marked at the appropriate site for injection placement. The knee was sterilely prepped with Betadine. A 1 mg solution of Kenalog  was drawn up into a 5 mL syringe with a 2 mL of 1% lidocaine . The patient was injected with a 25-gauge needle at the anteriolateral aspect of her bilateral flexed knees. There were no complications. The patient tolerated the procedure well. There was minimal bleeding. The patient was instructed to go to the emergency room with any usual pain, swelling, or redness occurred in the injected area.  Kenolog WUJ:[id number]; EXP: 2026-08 Lidocaine  GNF:[id number]; EXP:09/27/24   The procedure was supervised by attending physician, Dr. Adriane Albe.

## 2023-11-13 NOTE — Patient Instructions (Addendum)
 Thank you, [name] for allowing us  to provide your care today.   I hope the knee injections help! If you develop swelling, redness or increased pain please call clinic or go to urgent care.  I am checking diabetes labs and vitamin d. The imaging for bone density showed you bones are a little thin but you DO NOT have osteoporosis. If vitamin d is low would be good to start you on a supplement. I will call with results  I have ordered the following labs for you:  Lab Orders         VITAMIN D 25 Hydroxy (Vit-D Deficiency, Fractures)         Hemoglobin A1c      I have ordered the following medication/changed the following medications:   Stop the following medications: Medications Discontinued During This Encounter  Medication Reason   amLODipine -olmesartan  (AZOR ) 5-40 MG tablet      Start the following medications: No orders of the defined types were placed in this encounter.     We look forward to seeing you next time. Please call our clinic at [phone] if you have any questions or concerns. The best time to call is Monday-Friday from 9am-4pm, but there is someone available 24/7. If after hours or the weekend, call the main hospital number and ask for the Internal Medicine Resident On-Call. If you need medication refills, please notify your pharmacy one week in advance and they will send us  a request.   Thank you for trusting me with your care. Wishing you the best!   Karalee Oscar, DO [facility]

## 2023-11-13 NOTE — Assessment & Plan Note (Signed)
 Dexa score at -1.6 consistent with osteopenia.  P: Check vitamin d  Addednum: Vitamin d 28. Start vitamin d 600-800 units.

## 2023-11-13 NOTE — Progress Notes (Signed)
 Subjective:  CC: pain in both knees  HPI:  Caitlyn Ingram is a 68 y.o. female with a past medical history of htn, diabetes, bilateral knee osteoarthritis who presents today for knee pain.  She had knee injections done about 3 months ago. Typically this brings her pain down to 2-3 and she is able to complete activities for >6 months. After the last injections she only had symptoms relief for about a month. She has been using tylenol on days pain is severe. Voltaren  gel is not helpful. She is not interested in physical therapy.  She also asked about bone density testing as she has not heard back about results.   Please see problem based assessment and plan for additional details.  Past Medical History:  Diagnosis Date   Abnormal urine finding 09/20/2013   Allergic urticaria 04/10/2015   Allergy    Cough 10/14/2016   Folliculitis 09/01/2017   Foot pain, right 11/22/2017   H/O bronchitis 08/19/2011   Managed with albuterol  inhaler   H/O hiatal hernia    Health care maintenance 07/01/2013   Healthcare maintenance 07/01/2013   Hyperlipidemia    Hypertension    Hypokalemia 10/29/2012   Knee effusion, left 04/02/2018   Musculoskeletal pain 09/20/2013   Tendinopathy of right rotator cuff 10/02/2017   MEDICATIONS:  olmesartan  40 mg Rosuvastatin  20 mg  Family History  Problem Relation Age of Onset   Cancer Neg Hx    Stroke Neg Hx     Past Surgical History:  Procedure Laterality Date   DILATION AND CURETTAGE OF UTERUS     EUS N/A 10/30/2013   Procedure: LOWER ENDOSCOPIC ULTRASOUND (EUS);  Surgeon: Evangeline Hilts, MD;  Location: [facility];  Service: Endoscopy;  Laterality: N/A;   TUBAL LIGATION       Social History   Socioeconomic History   Marital status: Legally Separated    Spouse name: Not on file   Number of children: Not on file   Years of education: Not on file   Highest education level: Not on file  Occupational History   Not on file  Tobacco Use   Smoking status:  Every Day    Current packs/day: 0.10    Types: Cigarettes   Smokeless tobacco: Never   Tobacco comments:    Cutting back. 5  per day  Substance and Sexual Activity   Alcohol use: Yes    Alcohol/week: 0.0 standard drinks of alcohol    Comment: Rarely.   Drug use: No   Sexual activity: Not on file  Other Topics Concern   Not on file  Social History Narrative   Not on file   Social Drivers of Health   Financial Resource Strain: Low Risk  (01/18/2022)   Overall Financial Resource Strain (CARDIA)    Difficulty of Paying Living Expenses: Not hard at all  Food Insecurity: No Food Insecurity (01/18/2022)   Hunger Vital Sign    Worried About Running Out of Food in the Last Year: Never true    Ran Out of Food in the Last Year: Never true  Transportation Needs: No Transportation Needs (01/18/2022)   PRAPARE - Administrator, Civil Service (Medical): No    Lack of Transportation (Non-Medical): No  Physical Activity: Unknown (01/18/2022)   Exercise Vital Sign    Days of Exercise per Week: 7 days    Minutes of Exercise per Session: Not on file  Stress: No Stress Concern Present (01/18/2022)   [employer] of  Occupational Health - Occupational Stress Questionnaire    Feeling of Stress : Not at all  Social Connections: Moderately Integrated (01/18/2022)   Social Connection and Isolation Panel    Frequency of Communication with Friends and Family: More than three times a week    Frequency of Social Gatherings with Friends and Family: More than three times a week    Attends Religious Services: More than 4 times per year    Active Member of [employer] or Organizations: Yes    Attends Banker Meetings: More than 4 times per year    Marital Status: Widowed  Intimate Partner Violence: Not At Risk (01/18/2022)   Humiliation, Afraid, Rape, and Kick questionnaire    Fear of Current or Ex-Partner: No    Emotionally Abused: No    Physically Abused: No    Sexually Abused: No     Review of Systems: ROS negative except for what is noted on the assessment and plan.  Objective:   Vitals:   11/13/23 1346 11/13/23 1421  BP: (!) 183/81 (!) 188/93  Pulse: (!) 101 73  Temp: 98.3 F (36.8 C)   TempSrc: Oral   SpO2: 100%   Weight: 162 lb 4.8 oz (73.6 kg)   Height: 5' 4 (1.626 m)     Physical Exam: Constitutional: well-appearing, in no acute distress Cardiovascular: regular rate and rhythm, no m/r/g Pulmonary/Chest: normal work of breathing on room air, lungs clear to auscultation bilaterally Abdominal: soft, non-tender, non-distended MSK:  Right knee-  - Inspection: no effusion or erythema - Palpation: pain to palpation over anteriolateral knee - ROM: decreased flexion due to pain - Strength: 5/5 in flexion and extension Left knee-  - Inspection: no effusion or erythema - Palpation: pain to palpation over anteriolateral knee - ROM: normal range of motion with flexion and extension - Strength: 5/5 in flexion and extension  Assessment & Plan:  Osteoarthritis of knees, bilateral Last xray in 2023 with mild tricompartmental OA of left and mild patellofemoral OA of right. She reports good relief in pain with injections in the past and that symptoms were relieved for more than 6 months. This time she only had relief for one month. A1c is well controlled. PROCEDURE NOTE  PROCEDURE: bilateral knee joint steroid injection.  PREOPERATIVE DIAGNOSIS: Osteoarthritis of the bilateral knee.  POSTOPERATIVE DIAGNOSIS: Osteoarthritis of the bilateral knee.  PROCEDURE: The patient was apprised of the risks and the benefits of the procedure and informed consent was obtained. Time-out procedure was performed, with confirmation of the patient's name, date of birth, and correct identification of the bilateral knee to be injected. The patient's knee was then marked at the appropriate site for injection placement. The knee was sterilely prepped with Betadine. A 1 mg solution  of Kenalog  was drawn up into a 5 mL syringe with a 2 mL of 1% lidocaine . The patient was injected with a 25-gauge needle at the anteriolateral aspect of her bilateral flexed knees. There were no complications. The patient tolerated the procedure well. There was minimal bleeding. The patient was instructed to go to the emergency room with any usual pain, swelling, or redness occurred in the injected area.  Kenolog VQQ:[id number]; EXP: 2026-08 Lidocaine  IEP:[id number]; EXP:09/27/24   The procedure was supervised by attending physician, Dr. Adriane Albe.   Osteopenia Dexa score at -1.6 consistent with osteopenia.  P: Check vitamin d  DMII (diabetes mellitus, type 2) (HCC) Will recheck A1c. She is not on medications for diabetes. She does not want to take  statin and reports that she has not since it was prescribed. We reviewed indications for statins with diabetes and she endorsed understanding but did not want to be on medication. She did not have side effects. P: Crestor  removed from medication list    Patient discussed with Dr. Bettejane Brownie   Santanna Whitford, D.O. [facility]  PGY-3 Pager: [phone]  Phone: [phone] Date 11/13/2023  Time 4:24 PM

## 2023-11-13 NOTE — Assessment & Plan Note (Addendum)
 Will recheck A1c. She is not on medications for diabetes. She does not want to take statin and reports that she has not since it was prescribed. We reviewed indications for statins with diabetes and she endorsed understanding but did not want to be on medication. She did not have side effects. P: Crestor  removed from medication list

## 2023-11-14 ENCOUNTER — Ambulatory Visit: Payer: Self-pay | Admitting: Internal Medicine

## 2023-11-14 LAB — HEMOGLOBIN A1C
Est. average glucose Bld gHb Est-mCnc: 146 mg/dL
Hgb A1c MFr Bld: 6.7 % — ABNORMAL HIGH (ref 4.8–5.6)

## 2023-11-14 LAB — VITAMIN D 25 HYDROXY (VIT D DEFICIENCY, FRACTURES): Vit D, 25-Hydroxy: 28.2 ng/mL — ABNORMAL LOW (ref 30.0–100.0)

## 2023-11-14 MED ORDER — VITAMIN D3 20 MCG (800 UNIT) PO TABS: 1.0000 | ORAL_TABLET | Freq: Every day | ORAL | 3 refills | Status: AC

## 2023-11-14 NOTE — Addendum Note (Signed)
 Addended by: Bishop Bullock on: 11/14/2023 12:22 PM   Modules accepted: Orders

## 2023-11-17 NOTE — Progress Notes (Signed)
 Internal Medicine Clinic Attending  I was physically present during the key portions of the resident provided service and participated in the medical decision making of patient's management care. I reviewed pertinent patient test results.  The assessment, diagnosis, and plan were formulated together and I agree with the documentation in the resident's note.  I was present for the entire procedure.  Gust Rung, DO

## 2023-12-11 ENCOUNTER — Other Ambulatory Visit: Payer: Self-pay

## 2023-12-11 DIAGNOSIS — I10 Essential (primary) hypertension: Secondary | ICD-10-CM

## 2023-12-11 MED ORDER — IBUPROFEN 400 MG PO TABS: ORAL_TABLET | ORAL | 0 refills | Status: DC

## 2023-12-11 MED ORDER — OLMESARTAN MEDOXOMIL 40 MG PO TABS: 40.0000 mg | ORAL_TABLET | Freq: Every day | ORAL | 0 refills | Status: DC

## 2023-12-11 NOTE — Telephone Encounter (Signed)
 Copied from CRM [id number]. Topic: Clinical - Medication Refill >> Dec 11, 2023 12:29 PM Zane F wrote: Patient is calling after several failed attempts by her pharmacy and them not being able to refill the two medications listed below. Patient is now completely out of her blood pressure medication (olmesartan  (BENICAR ) 40 MG tablet ).    Medication:  1.  olmesartan  (BENICAR ) 40 MG tablet   2. ibuprofen  (ADVIL ) 400 MG tablet  Has the patient contacted their pharmacy? Yes   This is the patient's preferred pharmacy:  [facility] [place] [address] [place], [place] - [address] [place] [address] Phone: [phone] Fax: [phone]  Is this the correct pharmacy for this prescription? Yes   Has the prescription been filled recently? No  Is the patient out of the medication? Yes  Has the patient been seen for an appointment in the last year OR does the patient have an upcoming appointment? Yes  Can we respond through MyChart? No  Agent: Please be advised that Rx refills may take up to 3 business days. We ask that you follow-up with your pharmacy.

## 2023-12-20 ENCOUNTER — Other Ambulatory Visit: Payer: Self-pay | Admitting: Family Medicine

## 2023-12-20 DIAGNOSIS — Z1231 Encounter for screening mammogram for malignant neoplasm of breast: Secondary | ICD-10-CM

## 2024-01-03 ENCOUNTER — Ambulatory Visit

## 2024-01-08 ENCOUNTER — Other Ambulatory Visit: Payer: Self-pay | Admitting: Student

## 2024-01-08 DIAGNOSIS — I10 Essential (primary) hypertension: Secondary | ICD-10-CM

## 2024-01-09 NOTE — Telephone Encounter (Signed)
 Medication sent to pharmacy

## 2024-01-11 ENCOUNTER — Other Ambulatory Visit: Payer: Self-pay | Admitting: Student

## 2024-01-11 DIAGNOSIS — I10 Essential (primary) hypertension: Secondary | ICD-10-CM

## 2024-01-19 ENCOUNTER — Ambulatory Visit

## 2024-01-24 ENCOUNTER — Encounter: Admitting: Student

## 2024-02-03 ENCOUNTER — Other Ambulatory Visit: Payer: Self-pay | Admitting: Student

## 2024-02-03 DIAGNOSIS — I10 Essential (primary) hypertension: Secondary | ICD-10-CM

## 2024-02-05 NOTE — Telephone Encounter (Signed)
 Medication sent to pharmacy

## 2024-02-09 ENCOUNTER — Ambulatory Visit
Admission: RE | Admit: 2024-02-09 | Discharge: 2024-02-09 | Disposition: A | Discharge status: Home / Self Care | Source: Ambulatory Visit | Attending: Family Medicine | Admitting: Family Medicine

## 2024-02-09 DIAGNOSIS — Z1231 Encounter for screening mammogram for malignant neoplasm of breast: Secondary | ICD-10-CM | POA: Diagnosis not present

## 2024-02-12 ENCOUNTER — Telehealth: Payer: Self-pay | Admitting: *Deleted

## 2024-02-12 NOTE — Telephone Encounter (Signed)
 Copied from CRM #[id number]. Topic: Clinical - Medical Advice >> Feb 09, 2024  1:42 PM Merlynn A wrote: Reason for CRM: Patient called in regarding appointment. Please contact patient to advise if she will be able to get steroid injection in knees for arthritis. Patient can be reached at [phone].

## 2024-02-12 NOTE — Telephone Encounter (Signed)
 Pt's appt is Wednesday 9/17 w/DR Kandis (Dr Rosan is here per schedule).

## 2024-02-13 ENCOUNTER — Encounter: Admitting: Student

## 2024-02-13 NOTE — Telephone Encounter (Signed)
 Pt has been called and confirmed appt for the following:  Name: Caitlyn Ingram, Caitlyn Ingram MRN: [id number]  Date: 02/14/2024 Status: Sch  Time: 8:15 AM Length: 30  Visit Type: OFFICE VISIT [8002] Copay: $0.00  Provider: Kandis Perkins, DO Department: [facility]  Referring Provider: CHARMAYNE HOLMES CSN: [id number]  Notes: knee injection confirmed 9/16 kg  Made On: Change Notes: Confirmed: Change Notes: 02/12/2024 10:00 AM 02/12/2024 10:01 AM 02/13/2024 8:53 AM 02/13/2024 8:53 AM By: By: By: By: ELLISON, JADA ELLISON, JADA GOLDSTON, DARLENE C GOLDSTON, DARLENE C    Copied from CRM [id number]. Topic: Appointments - Appointment Scheduling >> Feb 12, 2024  8:44 AM Marda G wrote: Patient would like knee injections (last injection 10/2023). She would also like labs to check her potassium and have her bp checked. She also stated, If she can not do this at tomorrows appointment, can you please call and reschedule her to a day she can do it all.  Please advise

## 2024-02-14 ENCOUNTER — Ambulatory Visit (INDEPENDENT_AMBULATORY_CARE_PROVIDER_SITE_OTHER): Admitting: Student

## 2024-02-14 VITALS — BP 158/81 | HR 83 | Temp 98.2°F | Ht 64.0 in | Wt 162.4 lb

## 2024-02-14 DIAGNOSIS — M170 Bilateral primary osteoarthritis of knee: Secondary | ICD-10-CM | POA: Diagnosis not present

## 2024-02-14 DIAGNOSIS — E1169 Type 2 diabetes mellitus with other specified complication: Secondary | ICD-10-CM

## 2024-02-14 DIAGNOSIS — Z8639 Personal history of other endocrine, nutritional and metabolic disease: Secondary | ICD-10-CM

## 2024-02-14 DIAGNOSIS — I10 Essential (primary) hypertension: Secondary | ICD-10-CM | POA: Diagnosis not present

## 2024-02-14 DIAGNOSIS — E119 Type 2 diabetes mellitus without complications: Secondary | ICD-10-CM

## 2024-02-14 NOTE — Progress Notes (Unsigned)
 Established Patient Office Visit  Subjective   Patient ID: Caitlyn Ingram, female    DOB: [birth year]  Age: 68 y.o. MRN: [id number]  No chief complaint on file.   Caitlyn Ingram is a 68 y.o. who presents to the clinic for B/L knee injections for her B/L knee osteoarthritis. In the apst, each steroid injection would provide about 6 months of relief. This past round of injections in June provided about 2.5 months of relief. She uses ibuprofen  prn for pain relief. Additionally, she has concerns for low potassium. Please see problem based assessment and plan for additional details.    Patient Active Problem List   Diagnosis Date Noted   History of hypokalemia 02/16/2024   Osteopenia 11/13/2023   Acute sinusitis 02/08/2023   DMII (diabetes mellitus, type 2) (HCC) 04/11/2022   Osteoarthritis of knees, bilateral 09/22/2021   Tobacco abuse 10/29/2012   Essential hypertension 05/05/2006   Hyperlipidemia 03/23/2006   ALLERGIC RHINITIS 03/23/2006     Objective:     BP (!) 158/81 (BP Location: Left Arm, Patient Position: Sitting, Cuff Size: Normal)   Pulse 83   Temp 98.2 F (36.8 C) (Oral)   Ht 5' 4 (1.626 m)   Wt 162 lb 6.4 oz (73.7 kg)   SpO2 98%   BMI 27.88 kg/m  BP Readings from Last 3 Encounters:  02/14/24 (!) 158/81  11/13/23 (!) 188/93  08/23/23 (!) 190/89   Wt Readings from Last 3 Encounters:  02/14/24 162 lb 6.4 oz (73.7 kg)  11/13/23 162 lb 4.8 oz (73.6 kg)  08/23/23 164 lb 3.2 oz (74.5 kg)      Physical Exam Vitals reviewed.  Constitutional:      General: She is not in acute distress.    Appearance: She is not ill-appearing, toxic-appearing or diaphoretic.  Cardiovascular:     Rate and Rhythm: Normal rate and regular rhythm.  Pulmonary:     Effort: Pulmonary effort is normal.     Breath sounds: Normal breath sounds.  Musculoskeletal:     Comments: R knee is warm with an effusion present. Tenderness present to palpate the lateral and medial joint  lines.  Left knee is not warm and has tenderness to palpate the lateral and medical joint lines.   Skin:    General: Skin is warm and dry.  Psychiatric:        Mood and Affect: Mood and affect normal.     Results for orders placed or performed in visit on 02/14/24  Basic metabolic panel with GFR  Result Value Ref Range   Glucose 91 70 - 99 mg/dL   BUN 10 8 - 27 mg/dL   Creatinine, Ser 9.46 (L) 0.57 - 1.00 mg/dL   eGFR 899 >40 fO/fpw/8.26   BUN/Creatinine Ratio 19 12 - 28   Sodium 139 134 - 144 mmol/L   Potassium 4.1 3.5 - 5.2 mmol/L   Chloride 102 96 - 106 mmol/L   CO2 25 20 - 29 mmol/L   Calcium  9.7 8.7 - 10.3 mg/dL    Last metabolic panel Lab Results  Component Value Date   GLUCOSE 91 02/14/2024   NA 139 02/14/2024   K 4.1 02/14/2024   CL 102 02/14/2024   CO2 25 02/14/2024   BUN 10 02/14/2024   CREATININE 0.53 (L) 02/14/2024   EGFR 100 02/14/2024   CALCIUM  9.7 02/14/2024   PROT 7.5 12/23/2020   ALBUMIN 4.4 12/23/2020   LABGLOB 3.1 12/23/2020   AGRATIO 1.4 12/23/2020  BILITOT 0.3 12/23/2020   ALKPHOS 92 12/23/2020   AST 20 12/23/2020   ALT 9 12/23/2020   ANIONGAP 8 10/14/2016   Last lipids Lab Results  Component Value Date   CHOL 156 03/22/2023   HDL 59 03/22/2023   LDLCALC 77 03/22/2023   TRIG 115 03/22/2023   CHOLHDL 2.6 03/22/2023   Last hemoglobin A1c Lab Results  Component Value Date   HGBA1C 6.7 (H) 11/13/2023      The 10-year ASCVD risk score (Arnett DK, et al., 2019) is: 50.6%    Assessment & Plan:   Problem List Items Addressed This Visit       Cardiovascular and Mediastinum   Essential hypertension   Relevant Orders   Basic metabolic panel with GFR (Completed)     Endocrine   DMII (diabetes mellitus, type 2) (HCC) - Primary   Relevant Orders   Basic metabolic panel with GFR (Completed)     Musculoskeletal and Integument   Osteoarthritis of knees, bilateral   Patient signed consent to undergo bilateral knee injections.   Procedure completed by Dr. Trudy assisted by Dr. Kandis.  Each knee was prepped with betadine, and under cryospray analgesia, injected at superolateral aspects with 40 mg triamcinolone  solution and 3 cc 1% lidocaine  using 25 G needle.  Tolerated well, immediate improvement in pain upon weight bearing.    Plan: - Return in 3 months for repeat steroid injections of the bilateral knees -As always, patient was informed that she develops severe knee pain or swelling of her knees, to seek care at emergency department        Other   History of hypokalemia   Patient has a history of hypokalemia.  She reports concerns of hypokalemia today because 2 weeks prior she had palpitations, she usually has palpitations when she has hypokalemia.  When she had palpitations, she took a supplemental potassium packet that she had at home and the palpitations dissipated.  Plan: - Repeat BMP today does not show evidence of hypokalemia, no need for supplemental potassium at this time       Return in about 2 months (around 04/15/2024) for chronic condiitons .    Damien Kandis, DO

## 2024-02-14 NOTE — Patient Instructions (Signed)
 Thank you, Caitlyn Ingram for allowing us  to provide your care today. Today we discussed knee pain and you received knee injections.    I have ordered the following labs for you:   Lab Orders         Basic metabolic panel with GFR       I have ordered the following medication/changed the following medications:   Stop the following medications: There are no discontinued medications.   Start the following medications: No orders of the defined types were placed in this encounter.    Follow up: 2 months for chronic conditions    Should you have any questions or concerns please call the internal medicine clinic at [phone].     Please note that our late policy has changed.  If you are more than 15 minutes late to your appointment, you may be asked to reschedule your appointment.  Dr. Kandis, D.O. [facility]

## 2024-02-15 DIAGNOSIS — H35032 Hypertensive retinopathy, left eye: Secondary | ICD-10-CM | POA: Diagnosis not present

## 2024-02-15 DIAGNOSIS — H04123 Dry eye syndrome of bilateral lacrimal glands: Secondary | ICD-10-CM | POA: Diagnosis not present

## 2024-02-15 DIAGNOSIS — Z961 Presence of intraocular lens: Secondary | ICD-10-CM | POA: Diagnosis not present

## 2024-02-15 LAB — BASIC METABOLIC PANEL WITH GFR
BUN/Creatinine Ratio: 19 (ref 12–28)
BUN: 10 mg/dL (ref 8–27)
CO2: 25 mmol/L (ref 20–29)
Calcium: 9.7 mg/dL (ref 8.7–10.3)
Chloride: 102 mmol/L (ref 96–106)
Creatinine, Ser: 0.53 mg/dL — ABNORMAL LOW (ref 0.57–1.00)
Glucose: 91 mg/dL (ref 70–99)
Potassium: 4.1 mmol/L (ref 3.5–5.2)
Sodium: 139 mmol/L (ref 134–144)
eGFR: 100 mL/min/1.73 (ref >59)

## 2024-02-16 ENCOUNTER — Ambulatory Visit: Payer: Self-pay | Admitting: Student

## 2024-02-16 DIAGNOSIS — Z8639 Personal history of other endocrine, nutritional and metabolic disease: Secondary | ICD-10-CM | POA: Insufficient documentation

## 2024-02-16 NOTE — Assessment & Plan Note (Signed)
 Patient signed consent to undergo bilateral knee injections.  Procedure completed by Dr. Trudy assisted by Dr. Kandis.  Each knee was prepped with betadine, and under cryospray analgesia, injected at superolateral aspects with 40 mg triamcinolone  solution and 3 cc 1% lidocaine  using 25 G needle.  Tolerated well, immediate improvement in pain upon weight bearing.    Plan: - Return in 3 months for repeat steroid injections of the bilateral knees -As always, patient was informed that she develops severe knee pain or swelling of her knees, to seek care at emergency department

## 2024-02-16 NOTE — Assessment & Plan Note (Signed)
 Patient has a history of hypokalemia.  She reports concerns of hypokalemia today because 2 weeks prior she had palpitations, she usually has palpitations when she has hypokalemia.  When she had palpitations, she took a supplemental potassium packet that she had at home and the palpitations dissipated.  Plan: - Repeat BMP today does not show evidence of hypokalemia, no need for supplemental potassium at this time

## 2024-02-24 NOTE — Progress Notes (Signed)
Internal Medicine Clinic Attending  I was physically present during the key portions of the resident provided service and participated in the medical decision making of patient's management care. I reviewed pertinent patient test results.  The assessment, diagnosis, and plan were formulated together and I agree with the documentation in the resident's note.  Williams, Julie Anne, MD  

## 2024-02-29 ENCOUNTER — Ambulatory Visit: Payer: Self-pay

## 2024-02-29 ENCOUNTER — Other Ambulatory Visit: Payer: Self-pay

## 2024-02-29 ENCOUNTER — Ambulatory Visit

## 2024-02-29 VITALS — BP 181/86 | HR 86 | Ht 64.0 in | Wt 162.6 lb

## 2024-02-29 DIAGNOSIS — I499 Cardiac arrhythmia, unspecified: Secondary | ICD-10-CM

## 2024-02-29 DIAGNOSIS — I10 Essential (primary) hypertension: Secondary | ICD-10-CM

## 2024-02-29 DIAGNOSIS — L659 Nonscarring hair loss, unspecified: Secondary | ICD-10-CM

## 2024-02-29 NOTE — Patient Instructions (Signed)
 Thank you, Caitlyn Ingram, for allowing us  to provide your care today. Today we discussed . . .  > Heart palpitations       - We will check your thyroid levels today.        - We have referred you to a cardiologist for further work up  > Blood pressure       - You may cut your olmesartan  in half. Take your blood pressure at night and bring the log with you next visit.     I have ordered the following labs for you:  Lab Orders         TSH        Referrals ordered today:   Referral Orders         Ambulatory referral to Cardiology        Follow up: 1 month    Remember:  Should you have any questions or concerns please call the internal medicine clinic at [phone].     Schuyler Novak, DO [facility]

## 2024-02-29 NOTE — Telephone Encounter (Signed)
 FYI Only or Action Required?: Action required by provider: request for appointment.  Patient was last seen in primary care on 02/14/2024 by Kandis Perkins, DO.  Called Nurse Triage reporting Palpitations.  Symptoms began today.  Interventions attempted: Nothing.  Symptoms are: unchanged.  Triage Disposition: See HCP Within 4 Hours (Or PCP Triage)  Patient/caregiver understands and will follow disposition?: Yes  Copied from CRM [id number]. Topic: Clinical - Red Word Triage >> Feb 29, 2024  2:01 PM Merlynn A wrote: Red Word that prompted transfer to Nurse Triage: Irregular heart beat Reason for Disposition  Age > 60 years  (Exception: Brief heartbeat symptoms that went away and now feels well.)  Answer Assessment - Initial Assessment Questions Scheduled 02/29/24; pt reports on her way now and less than 15 minutes away. Nurse unable to give care advice, pt ended call.  Took 1 potassium pill today; 2 years ago potassium was low and took potassium, it helped then. 1. DESCRIPTION: Please describe your heart rate or heartbeat that you are having (e.g., fast/slow, regular/irregular, skipped or extra beats, palpitations)     2. ONSET: When did it start? (e.g., minutes, hours, days)      Week ago 3. DURATION: How long does it last (e.g., seconds, minutes, hours)     Sec and then it may not come back for hours 4. PATTERN Does it come and go, or has it been constant since it started?  Does it get worse with exertion?   Are you feeling it now?     Comes and goes, happened few minutes ago, when taking Potassium medication, it calms it down  6. HEART RATE: Can you tell me your heart rate? How many beats in 15 seconds?  Note: Not all patients can do this.       71s; unsure of exact number 7. RECURRENT SYMPTOM: Have you ever had this before? If Yes, ask: When was the last time? and What happened that time?      Week ago 8. CAUSE: What do you think is causing the  palpitations?     Pt thinks potassium is low and thinks it may be Olmesartan   9. CARDIAC HISTORY: Do you have any history of heart disease? (e.g., heart attack, angina, bypass surgery, angioplasty, arrhythmia)      no 10. OTHER SYMPTOMS: Do you have any other symptoms? (e.g., dizziness, chest pain, sweating, difficulty breathing)       Just having palpations, comes and goes, Denies chest pain, diff breath, dizziness  Protocols used: Heart Rate and Heartbeat Questions-A-AH

## 2024-02-29 NOTE — Assessment & Plan Note (Signed)
 The patient's blood pressure was elevated today however the patient believes that she has whitecoat syndrome and says that it is often 130s or 140s at home reading.  She is persistent in her concern that potassium is causing some of the symptoms.  We did discuss that her past medical history includes hypokalemia secondary to HCTZ use and that her recent BMPs have shown potassium within normal limits.  Also discussed that olmesartan  should help with this issue as it often causes hyperkalemia rather than hypo.  She would like to decrease her olmesartan  to 20 mg.  Instructed her that she may cut her pills in half and try 20 mg with close at home monitoring of her blood pressure.  Follow-up in 4 weeks where she has been instructed to bring her home blood pressure readings for reevaluation of her antihypertensive management.  Of note she has tried amlodipine  without success in the past and HCTZ which resulted in electrolyte abnormalities.

## 2024-02-29 NOTE — Progress Notes (Signed)
 Established Patient Office Visit  Subjective   Patient ID: Caitlyn Ingram, female    DOB: 1955-09-01  Age: 68 y.o. MRN: [id number]  Chief Complaint  Patient presents with   Palpitations    Palpitation about 2 weeks ago  potassium when she is out of her potassium medication- per patient   Caitlyn Ingram is a 68 year old female with past medical history of hyperlipidemia, hypertension, tobacco abuse, and type 2 diabetes who presents today for acute visit of evaluation of feeling an irregular heartbeat.  See problem-based plan assessment below. HPI    ROS    Objective:     BP (!) 181/86 (BP Location: Left Arm, Patient Position: Sitting, Cuff Size: Normal)   Pulse 86   Ht 5' 4 (1.626 m)   Wt 162 lb 9.6 oz (73.8 kg)   SpO2 99%   BMI 27.91 kg/m    Physical Exam   No results found for any visits on 02/29/24.    The 10-year ASCVD risk score (Arnett DK, et al., 2019) is: 60.7%    Assessment & Plan:   Assessment & Plan Irregular heartbeat Patient presents today after feeling an irregular heartbeat in her chest.  She reports that she has had issues with hypokalemia in the past and was given supplementation for this years ago.  She still does have potassium packets and did take a supplement today however still felt the irregular sensation.  By the time she presented to the office she was feeling fine.  She reports that this has been a chronic issue for years and has been told in the past that it was likely due to her potassium however she did have a BMP on 9/17 with no evidence of electrolyte abnormalities. EKG obtained today shows normal sinus rhythm with right bundle branch block prolonged QTc and T wave inversions in V1 and V2. The patient denies any anginal symptoms including chest pain, dyspnea, lightheadedness, nausea, or vomiting.  She reports that she has never had chest pain and does not have chest pain or any of the above listed symptoms with exercise or at rest.  I do  not suspect that the patient is having ACS at this time. She also denies symptoms associated with her palpitations.  The patient's ASCVD risk is 60.7% and would recommend following up with a cardiologist for ischemic workup and to assess possible coronary artery disease at this time.  We will obtain TSH and CBC as well as place outpatient referral to cardiology. Orders:   EKG 12-Lead   TSH   Ambulatory referral to Cardiology   CBC no Diff  Hair loss The patient is reporting hair loss on her scalp that she believes is due to her olmesartan . While this is a known, rare side effect, will work up for more common causes including thyroid disorder.  Orders:   TSH  Essential hypertension The patient's blood pressure was elevated today however the patient believes that she has whitecoat syndrome and says that it is often 130s or 140s at home reading.  She is persistent in her concern that potassium is causing some of the symptoms.  We did discuss that her past medical history includes hypokalemia secondary to HCTZ use and that her recent BMPs have shown potassium within normal limits.  Also discussed that olmesartan  should help with this issue as it often causes hyperkalemia rather than hypo.  She would like to decrease her olmesartan  to 20 mg.  Instructed her that she may cut her  pills in half and try 20 mg with close at home monitoring of her blood pressure.  Follow-up in 4 weeks where she has been instructed to bring her home blood pressure readings for reevaluation of her antihypertensive management.  Of note she has tried amlodipine  without success in the past and HCTZ which resulted in electrolyte abnormalities.        Problem List Items Addressed This Visit   None Visit Diagnoses       Irregular heartbeat    -  Primary   Relevant Orders   EKG 12-Lead (Completed)     Hair loss           No follow-ups on file.    Caitlyn Novak, DO

## 2024-02-29 NOTE — Telephone Encounter (Signed)
 RTC to patient.  On the awy to the Clinics for an appointment.

## 2024-03-02 LAB — SPECIMEN STATUS

## 2024-03-03 LAB — SPECIMEN STATUS REPORT

## 2024-03-03 LAB — TSH: TSH: 1.06 u[IU]/mL (ref 0.450–4.500)

## 2024-03-05 ENCOUNTER — Ambulatory Visit: Payer: Self-pay

## 2024-03-05 NOTE — Addendum Note (Signed)
 Addended by: ANTONE DWAYNE SAILOR on: 03/05/2024 01:55 PM   Modules accepted: Orders

## 2024-03-06 LAB — CBC

## 2024-03-07 ENCOUNTER — Ambulatory Visit: Payer: Self-pay | Admitting: Student

## 2024-03-07 NOTE — Progress Notes (Deleted)
   CC: ***  HPI:  [name] is a 68 y.o. female with past medical history of hypertension, type 2 diabetes, hyperlipidemia, tobacco use who presents for appointment regarding blood pressure.  Please see assessment plan for full HPI.  Medications: Hypertension: Olmesartan  40 mg daily History of bronchitis: Albuterol  Tobacco use: Nicotine  patch, nicotine  gum Pain: Voltaren  gel, ibuprofen  for 100 mg Vitamin D  deficiency vitamin D  use daily  Last seen in clinic on 02/29/2024 Patient had irregular heartbeat  Past Medical History:  Diagnosis Date   Abnormal urine finding 09/20/2013   Allergic urticaria 04/10/2015   Allergy    Cough 10/14/2016   Folliculitis 09/01/2017   Foot pain, right 11/22/2017   H/O bronchitis 08/19/2011   Managed with albuterol  inhaler   H/O hiatal hernia    Health care maintenance 07/01/2013   Healthcare maintenance 07/01/2013   Hyperlipidemia    Hypertension    Hypokalemia 10/29/2012   Knee effusion, left 04/02/2018   Musculoskeletal pain 09/20/2013   Tendinopathy of right rotator cuff 10/02/2017     Current Outpatient Medications:    albuterol  (PROAIR  HFA) 108 (90 Base) MCG/ACT inhaler, Inhale 1-2 puffs into the lungs every 6 (six) hours as needed for wheezing or shortness of breath., Disp: 18 g, Rfl: 1   Cholecalciferol (VITAMIN D3) 20 MCG (800 UNIT) TABS, Take 1 tablet by mouth daily., Disp: 30 tablet, Rfl: 3   diclofenac  Sodium (VOLTAREN  ARTHRITIS PAIN) 1 % GEL, Apply 4 g topically 4 (four) times daily., Disp: 150 g, Rfl: 3   ibuprofen  (ADVIL ) 400 MG tablet, TAKE 1/2 (ONE-HALF) TABLET BY MOUTH EVERY 8 HOURS AS NEEDED, Disp: 30 tablet, Rfl: 0   nicotine  (NICODERM CQ  - DOSED IN MG/24 HOURS) 14 mg/24hr patch, Place 1 patch (14 mg total) onto the skin daily., Disp: 28 patch, Rfl: 2   nicotine  polacrilex (NICORETTE ) 2 MG gum, Take 1 each (2 mg total) by mouth as needed for smoking cessation., Disp: 100 tablet, Rfl: 0   olmesartan  (BENICAR ) 40 MG tablet, Take 1  tablet by mouth once daily, Disp: 30 tablet, Rfl: 0  Review of Systems:  ***  Constitutional: Eye: Respiratory: Cardiovascular: GI: MSK: GU: Skin: Neuro: Endocrine:   Physical Exam:  There were no vitals filed for this visit. *** General: Patient is sitting comfortably in the room  Eyes: Pupils equal and reactive to light, EOM intact  Head: Normocephalic, atraumatic  Neck: Supple, nontender, full range of motion, No JVD Cardio: Regular rate and rhythm, no murmurs, rubs or gallops. 2+ pulses to bilateral upper and lower extremities  Chest: No chest tenderness Pulmonary: Clear to ausculation bilaterally with no rales, rhonchi, and crackles  Abdomen: Soft, nontender with normoactive bowel sounds with no rebound or guarding  Neuro: Alert and orientated x3. CN II-XII intact. Sensation intact to upper and lower extremities. 2+ patellar reflex.  Back: No midline tenderness, no step off or deformities noted. No paraspinal muscle tenderness.  Skin: No rashes noted  MSK: 5/5 strength to upper and lower extremities.    Assessment & Plan:   Assessment & Plan      Patient {GC/GE:[id number]::discussed with,seen with} Dr. {[id number]::Hlpoonli,Ynqqfjw,Floozw,Wjmzwimj,Tpoopjfd,Cpwrzwu}  Libby Blanch, DO Internal Medicine Resident PGY-3

## 2024-03-12 LAB — TSH

## 2024-03-20 ENCOUNTER — Other Ambulatory Visit (INDEPENDENT_AMBULATORY_CARE_PROVIDER_SITE_OTHER)

## 2024-03-20 DIAGNOSIS — I499 Cardiac arrhythmia, unspecified: Secondary | ICD-10-CM

## 2024-03-20 DIAGNOSIS — I10 Essential (primary) hypertension: Secondary | ICD-10-CM

## 2024-03-20 MED ORDER — OLMESARTAN MEDOXOMIL 40 MG PO TABS
20.0000 mg | ORAL_TABLET | Freq: Every day | ORAL | Status: DC
Start: 2024-03-20 — End: 2024-04-15

## 2024-03-20 NOTE — Addendum Note (Signed)
 Addended by: FRANCESCO FALLOW B on: 03/20/2024 10:47 AM   Modules accepted: Orders

## 2024-03-20 NOTE — Progress Notes (Addendum)
 Patient here for lab draw.   C/O hair failing out and feeling palpitations since starting the Olmesartan .  Patient stated that she started to take 1/2 tablet and the palpitations stopped.     Patient also feels that her Potassium is low and that may have been the problem.  Patient also stated that prior to her last Potassium check she had taken some left over packets of Potassium that she had before when she was on Potassium.   Patiens stated that she was unable to stay to talk with the doctor. Spoke with Dr. Francesco who wants patient to take Olmesartan  20 mg and to return to the Clinics in 2 weeks.  Does not need to take Potassium at this time. Will call patient to see if she will be able to come in for an appointment in a week  after being on the Olmesartan  20 mg.   Call to patient to inform her of the medication change ant to come in for an appointment on 04/03/2024 at 10:15. Message was left for patient to call the Clinics.

## 2024-03-20 NOTE — Progress Notes (Signed)
 Patient reported to nurse that she decreased her olmesartan  to 20mg  because she felt like 40mg  contributes to her palpitations and her hair falling out.  She left the clinic and agreed to come back for a follow up visit.  I am updating her med list to reflect this change.

## 2024-03-21 LAB — CBC
Hematocrit: 41.6 % (ref 34.0–46.6)
Hemoglobin: 13.6 g/dL (ref 11.1–15.9)
MCH: 28.6 pg (ref 26.6–33.0)
MCHC: 32.7 g/dL (ref 31.5–35.7)
MCV: 88 fL (ref 79–97)
Platelets: 321 x10E3/uL (ref 150–450)
RBC: 4.75 x10E6/uL (ref 3.77–5.28)
RDW: 13.1 % (ref 11.7–15.4)
WBC: 7.7 x10E3/uL (ref 3.4–10.8)

## 2024-04-02 NOTE — Progress Notes (Deleted)
   Established Patient Office Visit  Subjective   Patient ID: JAZ LANINGHAM, female    DOB: 02/07/1956  Age: 68 y.o. MRN: [id number]  No chief complaint on file.   Lavona Norsworthy is a 68 year old female with a past medical history of hypertension, type 2 diabetes mellitus, hyperlipidemia, tobacco abuse, and history of hypokalemia who presents today for follow up on her hypertension and for diabetes follow up. Please see problem-based assessment and plan below for details.     ROS    Objective:    There were no vitals taken for this visit. Physical Exam   No results found for any visits on 04/03/24.   The 10-year ASCVD risk score (Arnett DK, et al., 2019) is: 51.1%    Assessment & Plan:   Patient seen with {IMTSattending2025/2026:32924}.   Problem List Items Addressed This Visit       Cardiovascular and Mediastinum   Essential hypertension     Endocrine   DMII (diabetes mellitus, type 2) (HCC) - Primary    No follow-ups on file.    Madonna Flegal, DO Internal Medicine Resident, PGY-1 8:42 PM 04/02/2024

## 2024-04-02 NOTE — Patient Instructions (Incomplete)
 Thank you, [name] for allowing us  to provide your care today. Today we discussed your diabetes.    Referrals: -  New medications: -  I have ordered the following labs for you:  Lab Orders  No laboratory test(s) ordered today     I will call if any are abnormal. All of your labs can be accessed through My Chart.   My Chart Access: [email or web address]  Please follow-up in:    We look forward to seeing you next time. Please call our clinic at [phone] if you have any questions or concerns. The best time to call is Monday-Friday from 9am-4pm, but there is someone available 24/7. If after hours or the weekend, call the main hospital number and ask for the Internal Medicine Resident On-Call. If you need medication refills, please notify your pharmacy one week in advance and they will send us  a request.   Thank you for letting us  take part in your care. Wishing you the best!  Caitlyn Duddy, DO 04/02/2024, 8:29 PM [facility]

## 2024-04-03 ENCOUNTER — Ambulatory Visit

## 2024-04-03 VITALS — Ht 64.0 in | Wt 167.0 lb

## 2024-04-03 DIAGNOSIS — Z0000 Encounter for general adult medical examination without abnormal findings: Secondary | ICD-10-CM | POA: Diagnosis not present

## 2024-04-03 DIAGNOSIS — E1169 Type 2 diabetes mellitus with other specified complication: Secondary | ICD-10-CM

## 2024-04-03 DIAGNOSIS — I10 Essential (primary) hypertension: Secondary | ICD-10-CM

## 2024-04-03 NOTE — Telephone Encounter (Signed)
 Patient stated during her AWV that she would like to be referred for a colonoscopy and that she started taking her Olmesartan  20 mg every other day.  She stated no signs of palpitations but she has noticed hair loss.  Please advise.

## 2024-04-03 NOTE — Progress Notes (Addendum)
 I connected with  Caitlyn Ingram on 04/03/24 by a audio enabled telemedicine application and verified that I am speaking with the correct person using two identifiers.  Patient Location: Home  Provider Location: Office/Clinic  I discussed the limitations of evaluation and management by telemedicine. The patient expressed understanding and agreed to proceed.  Subjective:   Caitlyn Ingram is a 68 y.o. female who presents for a Medicare Annual Wellness Visit.  Allergies (verified) Flagyl  [metronidazole  hcl], Benadryl [diphenhydramine hcl], Hydrocodone-acetaminophen, Iodine, and Lisinopril    History: Past Medical History:  Diagnosis Date   Abnormal urine finding 09/20/2013   Allergic urticaria 04/10/2015   Allergy    Cough 10/14/2016   Folliculitis 09/01/2017   Foot pain, right 11/22/2017   H/O bronchitis 08/19/2011   Managed with albuterol  inhaler   H/O hiatal hernia    Health care maintenance 07/01/2013   Healthcare maintenance 07/01/2013   Hyperlipidemia    Hypertension    Hypokalemia 10/29/2012   Knee effusion, left 04/02/2018   Musculoskeletal pain 09/20/2013   Tendinopathy of right rotator cuff 10/02/2017   Past Surgical History:  Procedure Laterality Date   DILATION AND CURETTAGE OF UTERUS     EUS N/A 10/30/2013   Procedure: LOWER ENDOSCOPIC ULTRASOUND (EUS);  Surgeon: Elsie Cree, MD;  Location: [facility];  Service: Endoscopy;  Laterality: N/A;   TUBAL LIGATION     Family History  Problem Relation Age of Onset   Cancer Neg Hx    Stroke Neg Hx    Social History   Occupational History   Not on file  Tobacco Use   Smoking status: Every Day    Current packs/day: 0.10    Types: Cigarettes   Smokeless tobacco: Never   Tobacco comments:    Cutting back. 5  per day  Substance and Sexual Activity   Alcohol use: Yes    Alcohol/week: 0.0 standard drinks of alcohol    Comment: Rarely.   Drug use: No   Sexual activity: Not on file   Tobacco Counseling Ready to quit:  Not Answered Counseling given: Not Answered Tobacco comments: Cutting back. 5  per day  SDOH Screenings   Food Insecurity: No Food Insecurity (04/03/2024)  Housing: Low Risk  (04/03/2024)  Transportation Needs: No Transportation Needs (04/03/2024)  Utilities: Not At Risk (04/03/2024)  Alcohol Screen: Low Risk  (01/18/2022)  Depression (PHQ2-9): Low Risk  (04/03/2024)  Financial Resource Strain: Low Risk  (02/29/2024)  Physical Activity: Sufficiently Active (04/03/2024)  Social Connections: Moderately Integrated (04/03/2024)  Stress: No Stress Concern Present (04/03/2024)  Tobacco Use: High Risk (04/03/2024)  Health Literacy: Adequate Health Literacy (04/03/2024)   Depression Screen    04/03/2024   10:00 AM 02/29/2024    3:14 PM 04/07/2023    9:12 AM 03/22/2023    3:47 PM 10/03/2022    3:28 PM 06/16/2022    9:36 AM 04/08/2022    8:57 AM  PHQ 2/9 Scores  PHQ - 2 Score 0 0 0 0 0 0 0  PHQ- 9 Score 0      0     Goals Addressed             This Visit's Progress    04/03/2024: To stay healthy and keep living.         Visit info / Clinical Intake: Medicare Wellness Visit Type:: Subsequent Annual Wellness Visit Medicare Wellness Visit Mode:: Telephone If telephone or video:: pt reported vitals Interpreter Needed?: No Pre-visit prep was completed: yes AWV  questionnaire completed by patient prior to visit?: no Living arrangements:: with family/others ([facility] WITH PATIENT) Patient's Overall Health Status Rating: very good Typical amount of pain: none Does pain affect daily life?: no Are you currently prescribed opioids?: no  Dietary Habits and Nutritional Risks How many meals a day?: 2 Eats fruit and vegetables daily?: yes Most meals are obtained by: preparing own meals Diabetic:: no  Functional Status Activities of Daily Living (to include ambulation/medication): Independent Ambulation: Independent Medication Administration: Independent Home Management:  Independent Manage your own finances?: yes Primary transportation is: driving Concerns about vision?: no *vision screening is required for WTM* Concerns about hearing?: no  Fall Screening Falls in the past year?: 0 Number of falls in past year: 0 Was there an injury with Fall?: 0 Fall Risk Category Calculator: 0 Patient Fall Risk Level: Low Fall Risk  Fall Risk Patient at Risk for Falls Due to: No Fall Risks Fall risk Follow up: Falls prevention discussed  Home and Transportation Safety: All rugs have non-skid backing?: N/A, no rugs All stairs or steps have railings?: N/A, no stairs Grab bars in the bathtub or shower?: (!) no Have non-skid surface in bathtub or shower?: (!) no Good home lighting?: yes Regular seat belt use?: yes Hospital stays in the last year:: no  Cognitive Assessment Difficulty concentrating, remembering, or making decisions? : no Will 6CIT or Mini Cog be Completed: no 6CIT or Mini Cog Declined: patient alert, oriented, able to answer questions appropriately and recall recent events  Advance Directives (For Healthcare) Does Patient Have a Medical Advance Directive?: No (DAUGHTER AWARE OF WISHES) Would patient like information on creating a medical advance directive?: No - Patient declined  Reviewed/Updated  Reviewed/Updated: All        Objective:    Today's Vitals   04/03/24 0951  Weight: 167 lb (75.8 kg)  Height: 5' 4 (1.626 m)  PainSc: 0-No pain   Body mass index is 28.67 kg/m.  Current Medications (verified) Outpatient Encounter Medications as of 04/03/2024  Medication Sig   albuterol  (PROAIR  HFA) 108 (90 Base) MCG/ACT inhaler Inhale 1-2 puffs into the lungs every 6 (six) hours as needed for wheezing or shortness of breath.   Cholecalciferol (VITAMIN D3) 20 MCG (800 UNIT) TABS Take 1 tablet by mouth daily.   diclofenac  Sodium (VOLTAREN  ARTHRITIS PAIN) 1 % GEL Apply 4 g topically 4 (four) times daily.   ibuprofen  (ADVIL ) 400 MG tablet  TAKE 1/2 (ONE-HALF) TABLET BY MOUTH EVERY 8 HOURS AS NEEDED   nicotine  (NICODERM CQ  - DOSED IN MG/24 HOURS) 14 mg/24hr patch Place 1 patch (14 mg total) onto the skin daily.   nicotine  polacrilex (NICORETTE ) 2 MG gum Take 1 each (2 mg total) by mouth as needed for smoking cessation.   olmesartan  (BENICAR ) 40 MG tablet Take 0.5 tablets (20 mg total) by mouth daily. (Patient taking differently: Take 20 mg by mouth daily.)   No facility-administered encounter medications on file as of 04/03/2024.   Hearing/Vision screen Hearing Screening - Comments:: Adequate hearing, no hearing aids. Vision Screening - Comments:: Adequate vision and uses reading glasses only.  [facility] doctor is [facility]. Immunizations and Health Maintenance Health Maintenance  Topic Date Due   FOOT EXAM  Never done   Pneumococcal Vaccine: 50+ Years (1 of 2 - PCV) Never done   OPHTHALMOLOGY EXAM  05/06/2023   DTaP/Tdap/Td (2 - Tdap) 09/21/2023   Colonoscopy  10/02/2023   Influenza Vaccine  Never done   COVID-19 Vaccine (4 -  2025-26 season) 01/29/2024   Diabetic kidney evaluation - Urine ACR  03/21/2024   HEMOGLOBIN A1C  05/14/2024   Diabetic kidney evaluation - eGFR measurement  02/13/2025   Medicare Annual Wellness (AWV)  04/03/2025   Mammogram  02/08/2026   DEXA SCAN  Completed   Hepatitis C Screening  Completed   Zoster Vaccines- Shingrix  Completed   Meningococcal B Vaccine  Aged Out        Assessment/Plan:  This is a routine wellness examination for [place].  Patient Care Team: Charmayne Holmes, DO as PCP - General [facility] as Consulting Physician (Ophthalmology) Octavia, Charlie Hamilton, MD as Consulting Physician (Ophthalmology)  I have personally reviewed and noted the following in the patient's chart:   Medical and social history Use of alcohol, tobacco or illicit drugs  Current medications and supplements including opioid prescriptions. Functional ability and status Nutritional  status Physical activity Advanced directives List of other physicians Hospitalizations, surgeries, and ER visits in previous 12 months Vitals Screenings to include cognitive, depression, and falls Referrals and appointments  No orders of the defined types were placed in this encounter.  In addition, I have reviewed and discussed with patient certain preventive protocols, quality metrics, and best practice recommendations. A written personalized care plan for preventive services as well as general preventive health recommendations were provided to patient.   Roz LOISE Fuller, LPN   88/08/7972   Return in about 1 year (around 04/03/2025) for Medicare wellness.  After Visit Summary: (Declined) Due to this being a telephonic visit, with patients personalized plan was offered to patient but patient Declined AVS at this time   Nurse Notes: Patient is requesting a referral for Colonoscopy.  Internal Medicine Attending:  I reviewed the AWV findings of the medical professional who conducted the visit. I was present in the office suite and immediately available to provide assistance and direction throughout the time the service was provided.

## 2024-04-03 NOTE — Patient Instructions (Signed)
 Caitlyn Ingram,  Thank you for taking the time for your Medicare Wellness Visit. I appreciate your continued commitment to your health goals. Please review the care plan we discussed, and feel free to reach out if I can assist you further.  Please note that Annual Wellness Visits do not include a physical exam. Some assessments may be limited, especially if the visit was conducted virtually. If needed, we may recommend an in-person follow-up with your provider.  Ongoing Care Seeing your primary care provider every 3 to 6 months helps us  monitor your health and provide consistent, personalized care.   Referrals If a referral was made during today's visit and you haven't received any updates within two weeks, please contact the referred provider directly to check on the status.  Recommended Screenings:  Health Maintenance  Topic Date Due   Complete foot exam   Never done   Pneumococcal Vaccine for age over 34 (1 of 2 - PCV) Never done   Eye exam for diabetics  05/06/2023   DTaP/Tdap/Td vaccine (2 - Tdap) 09/21/2023   Colon Cancer Screening  10/02/2023   Flu Shot  Never done   COVID-19 Vaccine (4 - 2025-26 season) 01/29/2024   Yearly kidney health urinalysis for diabetes  03/21/2024   Hemoglobin A1C  05/14/2024   Yearly kidney function blood test for diabetes  02/13/2025   Medicare Annual Wellness Visit  04/03/2025   Breast Cancer Screening  02/08/2026   DEXA scan (bone density measurement)  Completed   Hepatitis C Screening  Completed   Zoster (Shingles) Vaccine  Completed   Meningitis B Vaccine  Aged Out       04/03/2024    9:56 AM  Advanced Directives  Does Patient Have a Medical Advance Directive? No  Would patient like information on creating a medical advance directive? No - Patient declined    Vision: Annual vision screenings are recommended for early detection of glaucoma, cataracts, and diabetic retinopathy. These exams can also reveal signs of chronic conditions such as  diabetes and high blood pressure.  Dental: Annual dental screenings help detect early signs of oral cancer, gum disease, and other conditions linked to overall health, including heart disease and diabetes.  Please see the attached documents for additional preventive care recommendations.

## 2024-04-09 ENCOUNTER — Telehealth: Payer: Self-pay

## 2024-04-09 DIAGNOSIS — I10 Essential (primary) hypertension: Secondary | ICD-10-CM

## 2024-04-09 NOTE — Progress Notes (Signed)
 Pharmacy Quality Measure Review   This patient is appearing on a report for being at risk of failing the adherence measure for hypertension (ACEi/ARB) medications this calendar year.   Medication: olmesartan   Last fill date: 02/09/2024 for 30 day supply  Abs Fail date: 05/01/2024   Patient saw Dr. Myrna on 02/29/24 at which time BP was 181/86 mmHg. Patient endorsed white coat HTN with home SBP 130-140s mmHg. Patient requested to decrease olmesartan  to 20 mg daily and was told she could cut remaining 40 mg tabs in half and keep a close eye on home BP. She was supposed to return for follow-up ~03/31/24.  Called patient who reported that she is taking olmesartan  20 mg every other day (1/2 of 40 mg tablet). Reports that when she takes 20 mg every day she has palpitations. Reports she is monitoring her BP every 3 days. SBP consistently < 140 mmHg and DBP consistently < 80 mmHg. Advised her to bring home readings to upcoming appt which we scheduled today.  Patient requested for appt to be after Thanksgiving. Patient noted that she only has a few olmesartan  40  mg tablets remaining. Will collaborate with PCP to provide refills.  Patient requests that I communicate to her physician that she needs to schedule her colonoscopy, and that she would like a refill of ibuprofen  400 mg tab (previous directions where to take 1/2 tab PO Q8H as needed). Noted after our call that referral to cardiology was placed on 02/29/24 and this has not yet been scheduled.   Lorain Baseman, PharmD [facility] [phone]

## 2024-04-15 MED ORDER — OLMESARTAN MEDOXOMIL 20 MG PO TABS: 20.0000 mg | ORAL_TABLET | Freq: Every day | ORAL | 3 refills | Status: DC

## 2024-05-02 ENCOUNTER — Ambulatory Visit

## 2024-05-08 ENCOUNTER — Ambulatory Visit: Admitting: Student

## 2024-05-14 ENCOUNTER — Ambulatory Visit: Admitting: Student

## 2024-05-14 NOTE — Progress Notes (Deleted)
 02/29/2024  Irregular heartbeat Patient presents today after feeling an irregular heartbeat in her chest.  She reports that she has had issues with hypokalemia in the past and was given supplementation for this years ago.  She still does have potassium packets and did take a supplement today however still felt the irregular sensation.  By the time she presented to the office she was feeling fine.  She reports that this has been a chronic issue for years and has been told in the past that it was likely due to her potassium however she did have a BMP on 9/17 with no evidence of electrolyte abnormalities. EKG obtained today shows normal sinus rhythm with right bundle branch block prolonged QTc and T wave inversions in V1 and V2. The patient denies any anginal symptoms including chest pain, dyspnea, lightheadedness, nausea, or vomiting.  She reports that she has never had chest pain and does not have chest pain or any of the above listed symptoms with exercise or at rest.  I do not suspect that the patient is having ACS at this time. She also denies symptoms associated with her palpitations.  The patient's ASCVD risk is 60.7% and would recommend following up with a cardiologist for ischemic workup and to assess possible coronary artery disease at this time.  We will obtain TSH and CBC as well as place outpatient referral to cardiology. Orders:   EKG 12-Lead   TSH   Ambulatory referral to Cardiology   CBC no Diff   Hair loss The patient is reporting hair loss on her scalp that she believes is due to her olmesartan . While this is a known, rare side effect, will work up for more common causes including thyroid disorder.  Orders:   TSH   Essential hypertension The patient's blood pressure was elevated today however the patient believes that she has whitecoat syndrome and says that it is often 130s or 140s at home reading.  She is persistent in her concern that potassium is causing some of the symptoms.   We did discuss that her past medical history includes hypokalemia secondary to HCTZ use and that her recent BMPs have shown potassium within normal limits.  Also discussed that olmesartan  should help with this issue as it often causes hyperkalemia rather than hypo.  She would like to decrease her olmesartan  to 20 mg.  Instructed her that she may cut her pills in half and try 20 mg with close at home monitoring of her blood pressure.  Follow-up in 4 weeks where she has been instructed to bring her home blood pressure readings for reevaluation of her antihypertensive management.  Of note she has tried amlodipine  without success in the past and HCTZ which resulted in electrolyte abnormalities.    CC: Routine Follow Up for management of chronic medical conditions after last office visit 02/29/2024  HPI:  Caitlyn Ingram is a 68 y.o. female with pertinent PMH of hypertension, type 2 diabetes, hyperlipidemia, and tobacco use disorder who presents as above. Please see assessment and plan below for further details.  Medications: Current Outpatient Medications  Medication Instructions   albuterol  (PROAIR  HFA) 108 (90 Base) MCG/ACT inhaler 1-2 puffs, Inhalation, Every 6 hours PRN   Cholecalciferol (VITAMIN D3) 20 MCG (800 UNIT) TABS 1 tablet, Oral, Daily   diclofenac  Sodium (VOLTAREN  ARTHRITIS PAIN) 4 g, Topical, 4 times daily   ibuprofen  (ADVIL ) 400 MG tablet TAKE 1/2 (ONE-HALF) TABLET BY MOUTH EVERY 8 HOURS AS NEEDED   nicotine  (NICODERM CQ  -  DOSED IN MG/24 HOURS) 14 mg, Transdermal, Daily   nicotine  polacrilex (NICORETTE ) 2 mg, Oral, As needed   olmesartan  (BENICAR ) 20 mg, Oral, Daily     Review of Systems:   Pertinent items noted in HPI and/or A&P.  Physical Exam:  There were no vitals filed for this visit.  Constitutional:***. In no acute distress. HEENT: Normocephalic, atraumatic, Sclera non-icteric, PERRL, EOM intact Cardio:Regular rate and rhythm. 2+ bilateral {PulseLoc:28294}  pulses. Pulm:Clear to auscultation bilaterally. Normal work of breathing on room air. Abdomen: Soft, non-tender, non-distended, positive bowel sounds. FDX:Wzhjupcz for extremity edema. Skin:Warm and dry. Neuro:Alert and oriented x3. No focal deficit noted. Psych:Pleasant mood and affect.   Assessment & Plan:   Assessment & Plan   No orders of the defined types were placed in this encounter.    No follow-ups on file.   Patient {GC/GE:3044014::discussed with,seen with} {JGIMTSattending2025/2026:32954}  Fairy Pool, DO [facility] Internal Medicine Resident PGY-3 Clinic Phone: [phone] Please contact the on call pager at [phone] for any urgent or emergent needs.

## 2024-05-16 ENCOUNTER — Other Ambulatory Visit (INDEPENDENT_AMBULATORY_CARE_PROVIDER_SITE_OTHER)

## 2024-05-16 DIAGNOSIS — I10 Essential (primary) hypertension: Secondary | ICD-10-CM

## 2024-05-16 NOTE — Progress Notes (Cosign Needed)
 05/16/2024 Name: Caitlyn Ingram MRN: [id number] DOB: 20-Nov-1955  Chief Complaint  Patient presents with   Hypertension    Caitlyn Ingram is a 68 y.o. year old female who presented for a telephone visit.   They were referred to the pharmacist by a quality report for assistance in managing hypertension. PMH includes HTN, T2DM, arthritis, tobacco use, HLD.    Subjective: atient saw Dr. Myrna on 02/29/24 at which time BP was 181/86 mmHg. Patient endorsed white coat HTN with home SBP 130-140s mmHg. Patient requested to decrease olmesartan  to 20 mg daily and was told she could cut remaining 40 mg tabs in half and keep a close eye on home BP. She was supposed to return for follow-up ~03/31/24. At pharmacy call on 04/09/24, patient reported taking olmesartan  20 mg every other day due to side effect of palpitations. She reported home SBP consistently < 140 and SBP consistently < 80. We scheduled PCP appt for 05/02/24, but patient since rescheduled to 06/01/23.  Today, patient reports doing well. Still taking olmesartan  20 mg every other day. Says this is the only way she does not experience the side effect of palpitations. States her BP is controlled even on the days she does not take olmesartan . SBP in the 130s-140s. Not willing to retrial amlodipine  due to previous side effect of hair loss and weight gain. States hydrochlorothiazide  worked well for her in the past but she was taken off for hypokalemia.   Care Team: Primary Care Provider: Charmayne Holmes, DO ; Next Scheduled Visit: 05/31/24 with Dr. Harrie   Medication Access/Adherence  Current Pharmacy:  [id number] - [place], [place] - [address] [place] [address] Phone: [phone] Fax: [phone]   Patient reports affordability concerns with their medications: No  Patient reports access/transportation concerns to their pharmacy: No  Patient reports adherence concerns with their  medications:  Yes  - described above   Hypertension:  Current medications: olmesartan  20 mg daily (taking every other day, if she takes daily she has palpitations) Medications previously tried: amlodipine  (hair loss, weight gain), hydrochlorothiazide  (hyopkalemia)  Patient has anautomated, upper arm home BP cuff Current blood pressure readings readings: SBP in the 130s-140s  Patient denies hypotensive s/sx including dizziness, lightheadedness.  Patient denies hypertensive symptoms including headache, chest pain, shortness of breath   Objective:  BP Readings from Last 3 Encounters:  03/20/24 (!) 159/77  02/29/24 (!) 181/86  02/14/24 (!) 158/81    Lab Results  Component Value Date   HGBA1C 6.7 (H) 11/13/2023   HGBA1C 6.3 (A) 08/23/2023   HGBA1C 6.6 (A) 03/22/2023       Latest Ref Rng & Units 02/14/2024    9:36 AM 03/22/2023    4:23 PM 04/08/2022    9:57 AM  BMP  Glucose 70 - 99 mg/dL 91  86  98   BUN 8 - 27 mg/dL 10  11  10    Creatinine 0.57 - 1.00 mg/dL 9.46  9.42  9.42   BUN/Creat Ratio 12 - 28 19  19  18    Sodium 134 - 144 mmol/L 139  142  141   Potassium 3.5 - 5.2 mmol/L 4.1  3.5  3.9   Chloride 96 - 106 mmol/L 102  103  103   CO2 20 - 29 mmol/L 25  24  24    Calcium  8.7 - 10.3 mg/dL 9.7  9.4  9.8     Lab Results  Component Value Date  CHOL 156 03/22/2023   HDL 59 03/22/2023   LDLCALC 77 03/22/2023   TRIG 115 03/22/2023   CHOLHDL 2.6 03/22/2023    Medications Reviewed Today     Reviewed by Brinda Lorain SQUIBB, RPH-CPP (Pharmacist) on 05/16/24 at 1510  Med List Status: <None>   Medication Order Taking? Sig Documenting Provider Last Dose Status Informant  albuterol  (PROAIR  HFA) 108 (90 Base) MCG/ACT inhaler [id number]  Inhale 1-2 puffs into the lungs every 6 (six) hours as needed for wheezing or shortness of breath. Fernand Prost, MD  Active   Cholecalciferol (VITAMIN D3) 20 MCG (800 UNIT) TABS [id number]  Take 1 tablet by mouth daily. Masters, Izetta, DO  Active    diclofenac  Sodium (VOLTAREN  ARTHRITIS PAIN) 1 % GEL [id number]  Apply 4 g topically 4 (four) times daily. Lou Claretta HERO, MD  Active   ibuprofen  (ADVIL ) 400 MG tablet [id number]  TAKE 1/2 (ONE-HALF) TABLET BY MOUTH EVERY 8 HOURS AS NEEDED Elnora Ip, MD  Active   nicotine  (NICODERM CQ  - DOSED IN MG/24 HOURS) 14 mg/24hr patch [id number]  Place 1 patch (14 mg total) onto the skin daily. Nooruddin, Saad, MD  Active   nicotine  polacrilex (NICORETTE ) 2 MG gum [id number]  Take 1 each (2 mg total) by mouth as needed for smoking cessation. Nooruddin, Saad, MD  Active   olmesartan  (BENICAR ) 20 MG tablet [id number] Yes Take 1 tablet (20 mg total) by mouth daily. Myrna Bitters, DO  Active               Assessment/Plan:   Hypertension: - Currently uncontrolled with home and clinic BP consistently above goal less than 130/80. Reviewed goal blood pressure today. Advised patient that we would recommend adding additional medication to get her BP consistently to goal. Patient prefers to wait until PCP appt to discuss. Since she had hair loss and weight gain with amlodipine  in the past, could trial nifedipine XR 30 mg daily. Would avoid hypokalemia due to hx of low potassium and palpitations. Patient is not having s/sx of hypo- or hyper-tension. She is appropriately treated with ARB due to T2DM + microalbuminuria. - Reviewed long term cardiovascular and renal outcomes of uncontrolled blood pressure - Reviewed appropriate blood pressure monitoring technique and reviewed goal blood pressure. Recommended to check home blood pressure and heart rate  once daily and keep a log to bring to upcoming appointments. Patient confirms she will bring log to upcoming appt. - Recommend to take olmesartan  20 mg daily, but patient prefers to continue 20 mg every other day - Consider initiation of alternative CCB nifedipine XR 30 mg daily if blood pressure at next clinic appt remains elevated - Ensure patient  scheduled follow-up with cardiology for palpitations  - Patient due for A1C, lipid panel, and UACR at follow-up  Written patient instructions provided. Patient verbalized understanding of treatment plan.   Follow Up Plan:  Pharmacist telephone 07/08/24 PCP clinic visit in 05/31/24   Lorain Brinda, PharmD [facility] [phone]

## 2024-05-31 ENCOUNTER — Telehealth: Payer: Self-pay | Admitting: *Deleted

## 2024-05-31 ENCOUNTER — Encounter: Payer: Self-pay | Admitting: Student

## 2024-05-31 ENCOUNTER — Ambulatory Visit: Admitting: Student

## 2024-05-31 VITALS — BP 187/76 | HR 88 | Temp 98.3°F | Ht 64.0 in | Wt 165.0 lb

## 2024-05-31 DIAGNOSIS — Z1211 Encounter for screening for malignant neoplasm of colon: Secondary | ICD-10-CM

## 2024-05-31 DIAGNOSIS — M170 Bilateral primary osteoarthritis of knee: Secondary | ICD-10-CM | POA: Diagnosis not present

## 2024-05-31 DIAGNOSIS — E1169 Type 2 diabetes mellitus with other specified complication: Secondary | ICD-10-CM

## 2024-05-31 DIAGNOSIS — E559 Vitamin D deficiency, unspecified: Secondary | ICD-10-CM

## 2024-05-31 DIAGNOSIS — M1990 Unspecified osteoarthritis, unspecified site: Secondary | ICD-10-CM

## 2024-05-31 DIAGNOSIS — I10 Essential (primary) hypertension: Secondary | ICD-10-CM

## 2024-05-31 DIAGNOSIS — E119 Type 2 diabetes mellitus without complications: Secondary | ICD-10-CM | POA: Diagnosis not present

## 2024-05-31 DIAGNOSIS — Z720 Tobacco use: Secondary | ICD-10-CM

## 2024-05-31 DIAGNOSIS — E785 Hyperlipidemia, unspecified: Secondary | ICD-10-CM | POA: Diagnosis not present

## 2024-05-31 DIAGNOSIS — F17210 Nicotine dependence, cigarettes, uncomplicated: Secondary | ICD-10-CM | POA: Diagnosis not present

## 2024-05-31 DIAGNOSIS — Z79899 Other long term (current) drug therapy: Secondary | ICD-10-CM

## 2024-05-31 MED ORDER — BUPROPION HCL ER (SR) 150 MG PO TB12: 150.0000 mg | ORAL_TABLET | Freq: Two times a day (BID) | ORAL | 0 refills | Status: AC

## 2024-05-31 MED ORDER — OLMESARTAN MEDOXOMIL 20 MG PO TABS: 10.0000 mg | ORAL_TABLET | Freq: Every day | ORAL | 3 refills | Status: AC

## 2024-05-31 MED ORDER — IBUPROFEN 400 MG PO TABS: ORAL_TABLET | ORAL | 0 refills | Status: AC

## 2024-05-31 NOTE — Assessment & Plan Note (Signed)
 Will check A1c today. Controlled with diet/lifestyle currently.

## 2024-05-31 NOTE — Assessment & Plan Note (Signed)
 History of low D, will recheck. Currently on D3 800 u daily.

## 2024-05-31 NOTE — Telephone Encounter (Unsigned)
 Copied from CRM #[id number]. Topic: Clinical - Medication Question >> May 29, 2024 12:55 PM Caitlyn Ingram wrote: Reason for CRM: patient asking if she can get a knee injection in the office at her appointment on 05/31/2024 Patients last knee injection was 02/14/2024, but the patient states she was doing ok but all of a sudden they started to hurt today and she states Caitlyn Ingram did a great Job last time  Pt num  [phone]

## 2024-05-31 NOTE — Patient Instructions (Addendum)
 The smoking cessation medicine is called wellbutrin. You will take one pill twice daily for 12 weeks, but just one pill daily for the first 3 days. Try to quit smoking within the first two weeks of starting the medicine.  Please change your olmesartan  to 10mg  daily - one half pill.  I will check your bloodwork today.  Expect a phonecall to set up your colonoscopy.

## 2024-05-31 NOTE — Assessment & Plan Note (Signed)
 Smokes about 6 cigarettes per day. Interested in quitting. Very worried about side effects of these medicines which could make her crazy. As such she is agreeable to Wellbutrin. - Wellbutrion 150 BID x 12 weeks with taper on. Counseled on side effects. - previously failed nicotine  patch and gum

## 2024-05-31 NOTE — Assessment & Plan Note (Signed)
 High ASCVD risk attributed to smoking, HTN, and diabetes. Lipids actually usually good. In any case, suspect statin would benefit her for risk reduction and anti-inflammatory effect. - Lipid panel today, anticipated starting statin

## 2024-05-31 NOTE — Assessment & Plan Note (Signed)
 Received knee injections in September of last year and very satisfied. Unable to repeat today but will offer an appointment next week, as long as diabetes is not poorly controlled.

## 2024-05-31 NOTE — Progress Notes (Signed)
" ° °  CC: General Checkup. Requsts knee injection for arthritis.  HPI:  [name] is a 69 y.o. female with a PMH stated below who presents today for evaluation.  Please see problem based assessment and plan for additional details.  Past Medical History:  Diagnosis Date   Abnormal urine finding 09/20/2013   Allergic urticaria 04/10/2015   Allergy    Cough 10/14/2016   Folliculitis 09/01/2017   Foot pain, right 11/22/2017   H/O bronchitis 08/19/2011   Managed with albuterol  inhaler   H/O hiatal hernia    Health care maintenance 07/01/2013   Healthcare maintenance 07/01/2013   Hyperlipidemia    Hypertension    Hypokalemia 10/29/2012   Knee effusion, left 04/02/2018   Musculoskeletal pain 09/20/2013   Tendinopathy of right rotator cuff 10/02/2017    Review of Systems: ROS negative except for what is noted on the assessment and plan.  Vitals:   05/31/24 1021 05/31/24 1102  BP:  (!) 187/76  Pulse: 88   Temp: 98.3 F (36.8 C)   TempSrc: Oral   Weight: 165 lb (74.8 kg)   Height: 5' 4 (1.626 m)     Physical Exam: Constitutional: well-appearing woman in no acute distress Cardiovascular: regular rate and rhythm, no m/r/g Pulmonary/Chest: normal work of breathing on room air, lungs clear to auscultation bilaterally Abdominal: soft, non-tender, non-distended MSK: normal bulk and toneNeurological: alert & oriented x 3, no focal deficit Skin: warm and dry  Assessment & Plan:   Patient discussed with Dr. Shawn  Essential hypertension Blood pressure high in office at 187/76.  However she is quite adamant that her home measurements are much closer to 140/70s.  She reports a history of whitecoat hypertension.  She says that her home cuff has been verified with alternative cuffs by her sister who is a engineer, civil (consulting).  She has tried multiple medicines in the past including ACE inhibitors and calcium  channel blockers and hydrochlorothiazide  but has been dissatisfied the side effects.  She is  currently taking olmesartan  20 every other day which she feels is a good balance to prevent the fatigue and palpitations she commonly attributes to her antihypertensive medicines, and this issues with her current regimen.  However she needs more control.  -For now change olmesartan  to 10 mg every day.  I told her that if that does not provide adequate control, additional therapy such as a beta-blocker might be needed.  DMII (diabetes mellitus, type 2) (HCC) Will check A1c today. Controlled with diet/lifestyle currently.  Tobacco abuse Smokes about 6 cigarettes per day. Interested in quitting. Very worried about side effects of these medicines which could make her crazy. As such she is agreeable to Wellbutrin. - Wellbutrion 150 BID x 12 weeks with taper on. Counseled on side effects. - previously failed nicotine  patch and gum  Hyperlipidemia High ASCVD risk attributed to smoking, HTN, and diabetes. Lipids actually usually good. In any case, suspect statin would benefit her for risk reduction and anti-inflammatory effect. - Lipid panel today, anticipated starting statin  Vitamin D  deficiency History of low D, will recheck. Currently on D3 800 u daily.  Osteoarthritis of knees, bilateral Received knee injections in September of last year and very satisfied. Unable to repeat today but will offer an appointment next week, as long as diabetes is not poorly controlled.  RTC next week for knee injection.  Lonni Africa, D.O. [facility] Internal Medicine, PGY-2 Phone: [phone] Date 05/31/2024 Time 12:10 PM "

## 2024-05-31 NOTE — Progress Notes (Signed)
 Internal Medicine Clinic Attending  Case discussed with the resident at the time of the visit.  We reviewed the resident's history and exam and pertinent patient test results.  I agree with the assessment, diagnosis, and plan of care documented in the resident's note.

## 2024-05-31 NOTE — Assessment & Plan Note (Signed)
 Blood pressure high in office at 187/76.  However she is quite adamant that her home measurements are much closer to 140/70s.  She reports a history of whitecoat hypertension.  She says that her home cuff has been verified with alternative cuffs by her sister who is a engineer, civil (consulting).  She has tried multiple medicines in the past including ACE inhibitors and calcium  channel blockers and hydrochlorothiazide  but has been dissatisfied the side effects.  She is currently taking olmesartan  20 every other day which she feels is a good balance to prevent the fatigue and palpitations she commonly attributes to her antihypertensive medicines, and this issues with her current regimen.  However she needs more control.  -For now change olmesartan  to 10 mg every day.  I told her that if that does not provide adequate control, additional therapy such as a beta-blocker might be needed.

## 2024-06-01 LAB — BASIC METABOLIC PANEL WITH GFR
BUN/Creatinine Ratio: 14 (ref 12–28)
BUN: 9 mg/dL (ref 8–27)
CO2: 24 mmol/L (ref 20–29)
Calcium: 9.5 mg/dL (ref 8.7–10.3)
Chloride: 103 mmol/L (ref 96–106)
Creatinine, Ser: 0.66 mg/dL (ref 0.57–1.00)
Glucose: 84 mg/dL (ref 70–99)
Potassium: 4.1 mmol/L (ref 3.5–5.2)
Sodium: 141 mmol/L (ref 134–144)
eGFR: 95 mL/min/1.73

## 2024-06-01 LAB — LIPID PANEL
Chol/HDL Ratio: 4.1 ratio (ref 0.0–4.4)
Cholesterol, Total: 260 mg/dL — ABNORMAL HIGH (ref 100–199)
HDL: 64 mg/dL
LDL Chol Calc (NIH): 174 mg/dL — ABNORMAL HIGH (ref 0–99)
Triglycerides: 123 mg/dL (ref 0–149)
VLDL Cholesterol Cal: 22 mg/dL (ref 5–40)

## 2024-06-01 LAB — HEMOGLOBIN A1C
Est. average glucose Bld gHb Est-mCnc: 148 mg/dL
Hgb A1c MFr Bld: 6.8 % — ABNORMAL HIGH (ref 4.8–5.6)

## 2024-06-01 LAB — MICROALBUMIN / CREATININE URINE RATIO
Creatinine, Urine: 34.5 mg/dL
Microalb/Creat Ratio: 20 mg/g{creat} (ref 0–29)
Microalbumin, Urine: 7 ug/mL

## 2024-06-01 LAB — VITAMIN D 25 HYDROXY (VIT D DEFICIENCY, FRACTURES): Vit D, 25-Hydroxy: 27.3 ng/mL — ABNORMAL LOW (ref 30.0–100.0)

## 2024-06-05 ENCOUNTER — Ambulatory Visit: Admitting: Student

## 2024-06-05 VITALS — BP 176/99 | HR 93 | Temp 97.8°F | Ht 64.0 in | Wt 164.4 lb

## 2024-06-05 DIAGNOSIS — M1990 Unspecified osteoarthritis, unspecified site: Secondary | ICD-10-CM

## 2024-06-05 DIAGNOSIS — Z1211 Encounter for screening for malignant neoplasm of colon: Secondary | ICD-10-CM

## 2024-06-05 NOTE — Progress Notes (Signed)
" ° °  CC: Joint injections for both knees  HPI:  Caitlyn.Caitlyn Ingram is a 69 y.o. female with a PMH stated below who presents today for joint injections.  Please see problem based assessment and plan for additional details.  Past Medical History:  Diagnosis Date   Abnormal urine finding 09/20/2013   Allergic urticaria 04/10/2015   Allergy    Cough 10/14/2016   Folliculitis 09/01/2017   Foot pain, right 11/22/2017   H/O bronchitis 08/19/2011   Managed with albuterol  inhaler   H/O hiatal hernia    Health care maintenance 07/01/2013   Healthcare maintenance 07/01/2013   Hyperlipidemia    Hypertension    Hypokalemia 10/29/2012   Knee effusion, left 04/02/2018   Musculoskeletal pain 09/20/2013   Tendinopathy of right rotator cuff 10/02/2017    Review of Systems: ROS negative except for what is noted on the assessment and plan.  Vitals:   06/05/24 0839  BP: (!) 176/99  Pulse: 93  Temp: 97.8 F (36.6 C)  TempSrc: Oral  SpO2: 100%  Weight: 164 lb 6.4 oz (74.6 kg)  Height: 5' 4 (1.626 m)   Physical Exam: Constitutional: well-appearing woman in no acute distress MSK: normal bulk and tone. There is no effusion, warmth, or acute tenderness to the bilateral knees. Skin: warm and dry Psych: normal mood and behavior  Assessment & Plan:   Patient seen with Dr. Trudy Assessment & Plan Osteoarthritis, unspecified osteoarthritis type, unspecified site Caitlyn Ingram presents today with request of knee joint injections for relief of osteoarthritic pain. She had these in the past with good result and toleration. The most recent injections were in September of 2025. Her pain returned over the last week and severely limits movement. Details below. She tolerated the procedure well and experienced relief. Can be repeated in 3 months.  Joint Injection/Arthrocentesis Date/Time: 06/05/2024 9:24 AM  Performed by: Harrie Bruckner, DO Authorized by: Trudy Mliss Dragon, MD  Indications: pain  Body  area: knee (Bilateral knees) Local anesthesia used: yes  Anesthesia: Local anesthesia used: yes Local Anesthetic: topical anesthetic  Sedation: Patient sedated: no  Preparation: Patient was prepped and draped in the usual sterile fashion. Needle gauge: 25g. Approach: superior Triamcinolone  amount: 40 mg Lidocaine  1% amount: 3 mL Colon cancer screening Referral for colonoscopy placed today  Bruckner Harrie, D.O. [facility] Internal Medicine, PGY-2 Phone: [phone] Date 06/05/2024 Time 10:23 AM "

## 2024-06-05 NOTE — Progress Notes (Deleted)
 Joint Injection/Arthrocentesis  Date/Time: 06/05/2024 9:24 AM  Performed by: Harrie Bruckner, DO Authorized by: Trudy Mliss Dragon, MD  Indications: pain  Body area: knee (Bilateral knees) Local anesthesia used: yes  Anesthesia: Local anesthesia used: yes Local Anesthetic: topical anesthetic  Sedation: Patient sedated: no  Preparation: Patient was prepped and draped in the usual sterile fashion. Needle gauge: 25g. Approach: superior Triamcinolone  amount: 40 mg Lidocaine  1% amount: 3 mL Comments:  Consent signed and procedure timeout 9:24 am. Bilateral knees were injected with triamcinolone  and lidocaine  solution as above form a superolateral approach. Topical anesthetic with cryospray was applied before injection. Pt tolerated the procedure without concern.

## 2024-06-10 ENCOUNTER — Ambulatory Visit: Payer: Self-pay | Admitting: Student

## 2024-06-10 DIAGNOSIS — Z1211 Encounter for screening for malignant neoplasm of colon: Secondary | ICD-10-CM

## 2024-06-18 NOTE — Progress Notes (Signed)
 Internal Medicine Clinic Attending  Case discussed with the resident at the time of the visit.  We reviewed the resident's history and exam and pertinent patient test results.  I agree with the assessment, diagnosis, and plan of care documented in the resident's note.

## 2024-07-05 ENCOUNTER — Telehealth: Payer: Self-pay

## 2024-07-05 DIAGNOSIS — I10 Essential (primary) hypertension: Secondary | ICD-10-CM

## 2024-07-05 NOTE — Telephone Encounter (Signed)
 Patient called regarding her rx for Olmesartan  20 mg. Patient is instructed to take 0.25mg  daily. Patient is requesting a rx for 10 mg because she doesn't want to split the pill. Please send in a new rx for Olmesartan  10 mg.  [facility] - [place], [place] - [address]  [address] [place], [place] [address]

## 2024-07-08 ENCOUNTER — Other Ambulatory Visit: Payer: Self-pay
